# Patient Record
Sex: Female | Born: 2000 | Hispanic: No | Marital: Single | Smoking: Never smoker
Health system: Southern US, Community
[De-identification: ages and names within clinical notes are randomized; demographics above are authoritative.]

## PROBLEM LIST (undated history)

## (undated) DIAGNOSIS — F332 Major depressive disorder, recurrent severe without psychotic features: Secondary | ICD-10-CM

## (undated) DIAGNOSIS — Z789 Other specified health status: Secondary | ICD-10-CM

---
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
## 2022-09-22 IMAGING — CR DG CHEST 2V
2 series · 2 of 2 positions shown · non-contrast
Comparison: None.

CLINICAL DATA: Chest pain

EXAM:
CHEST - 2 VIEW

[chest pa]
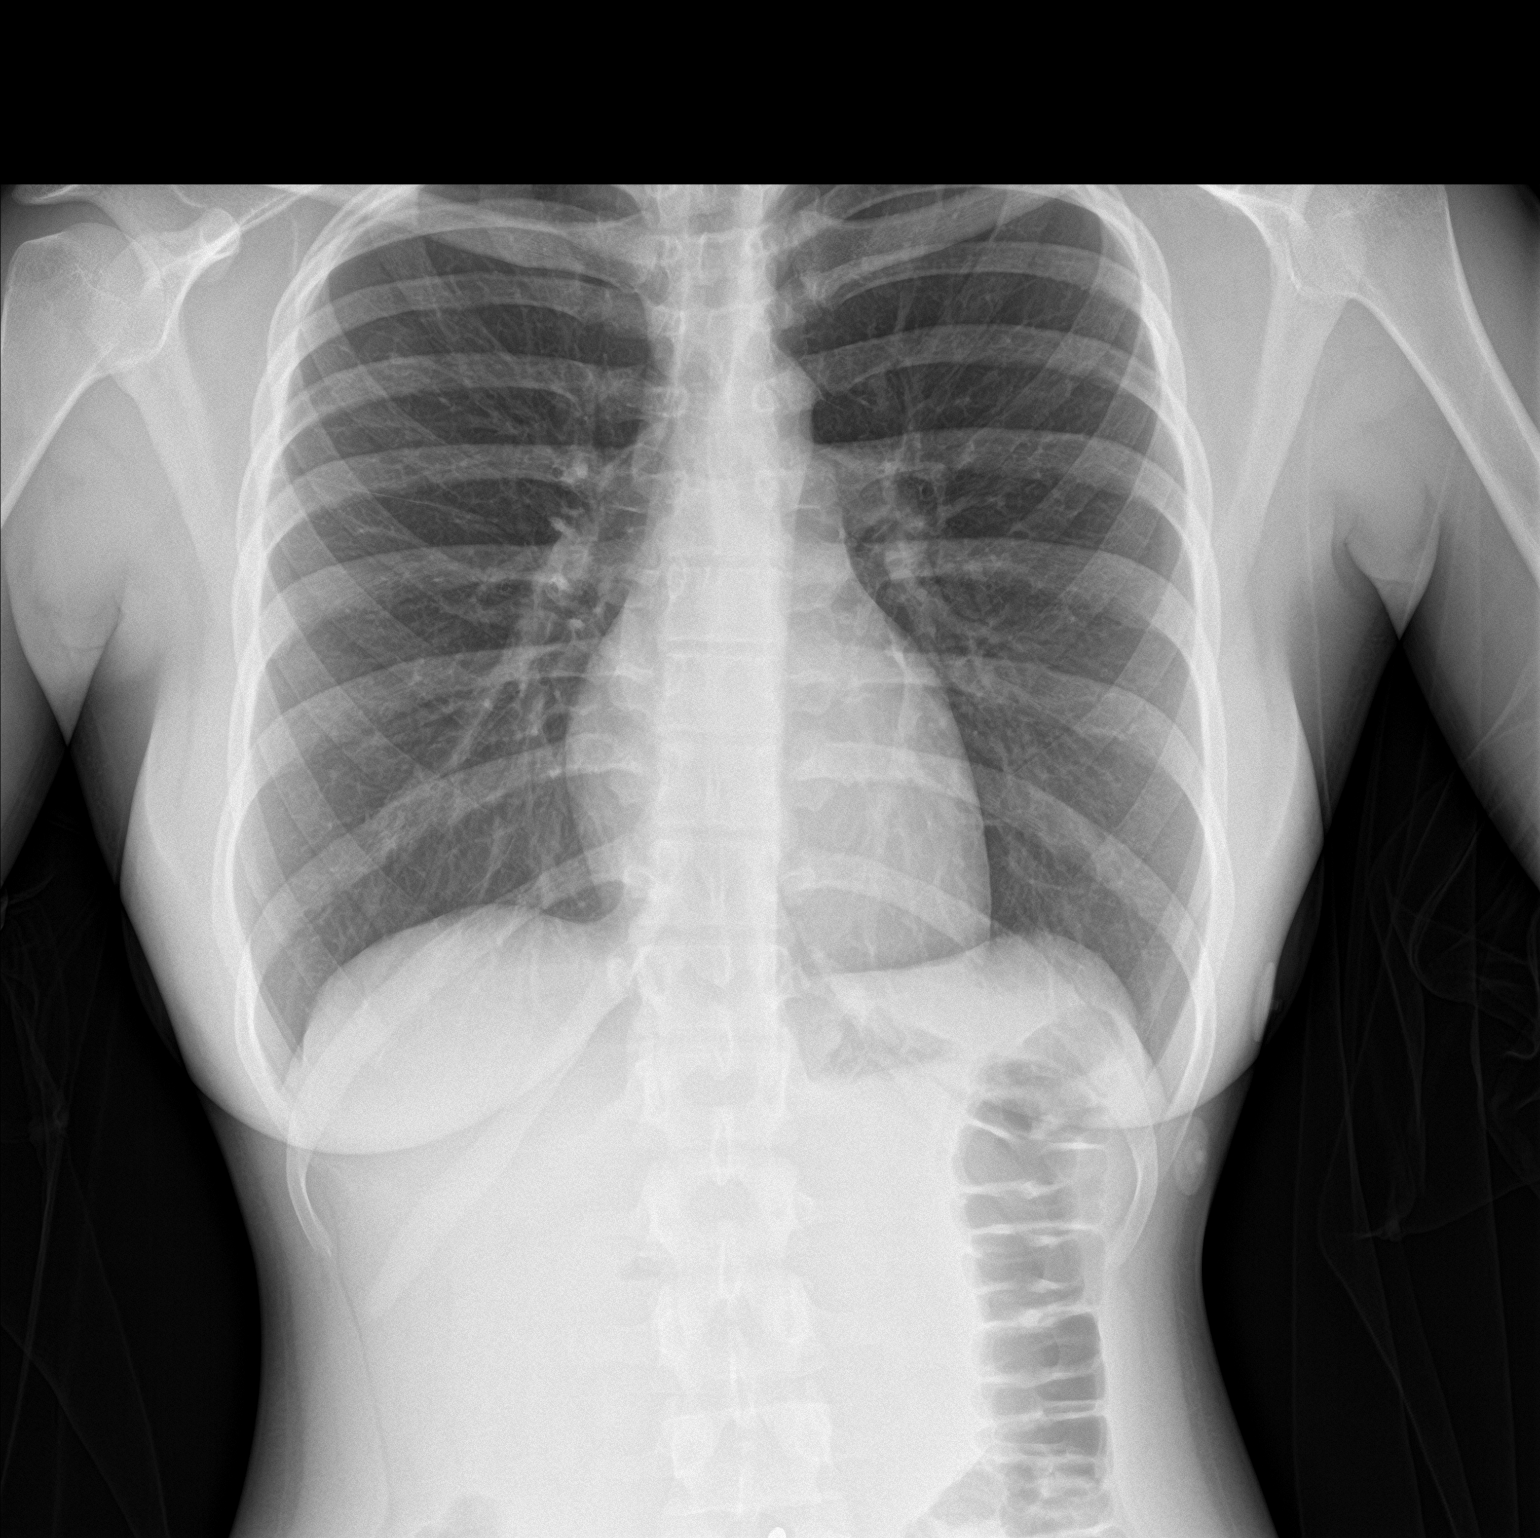

[chest lat]
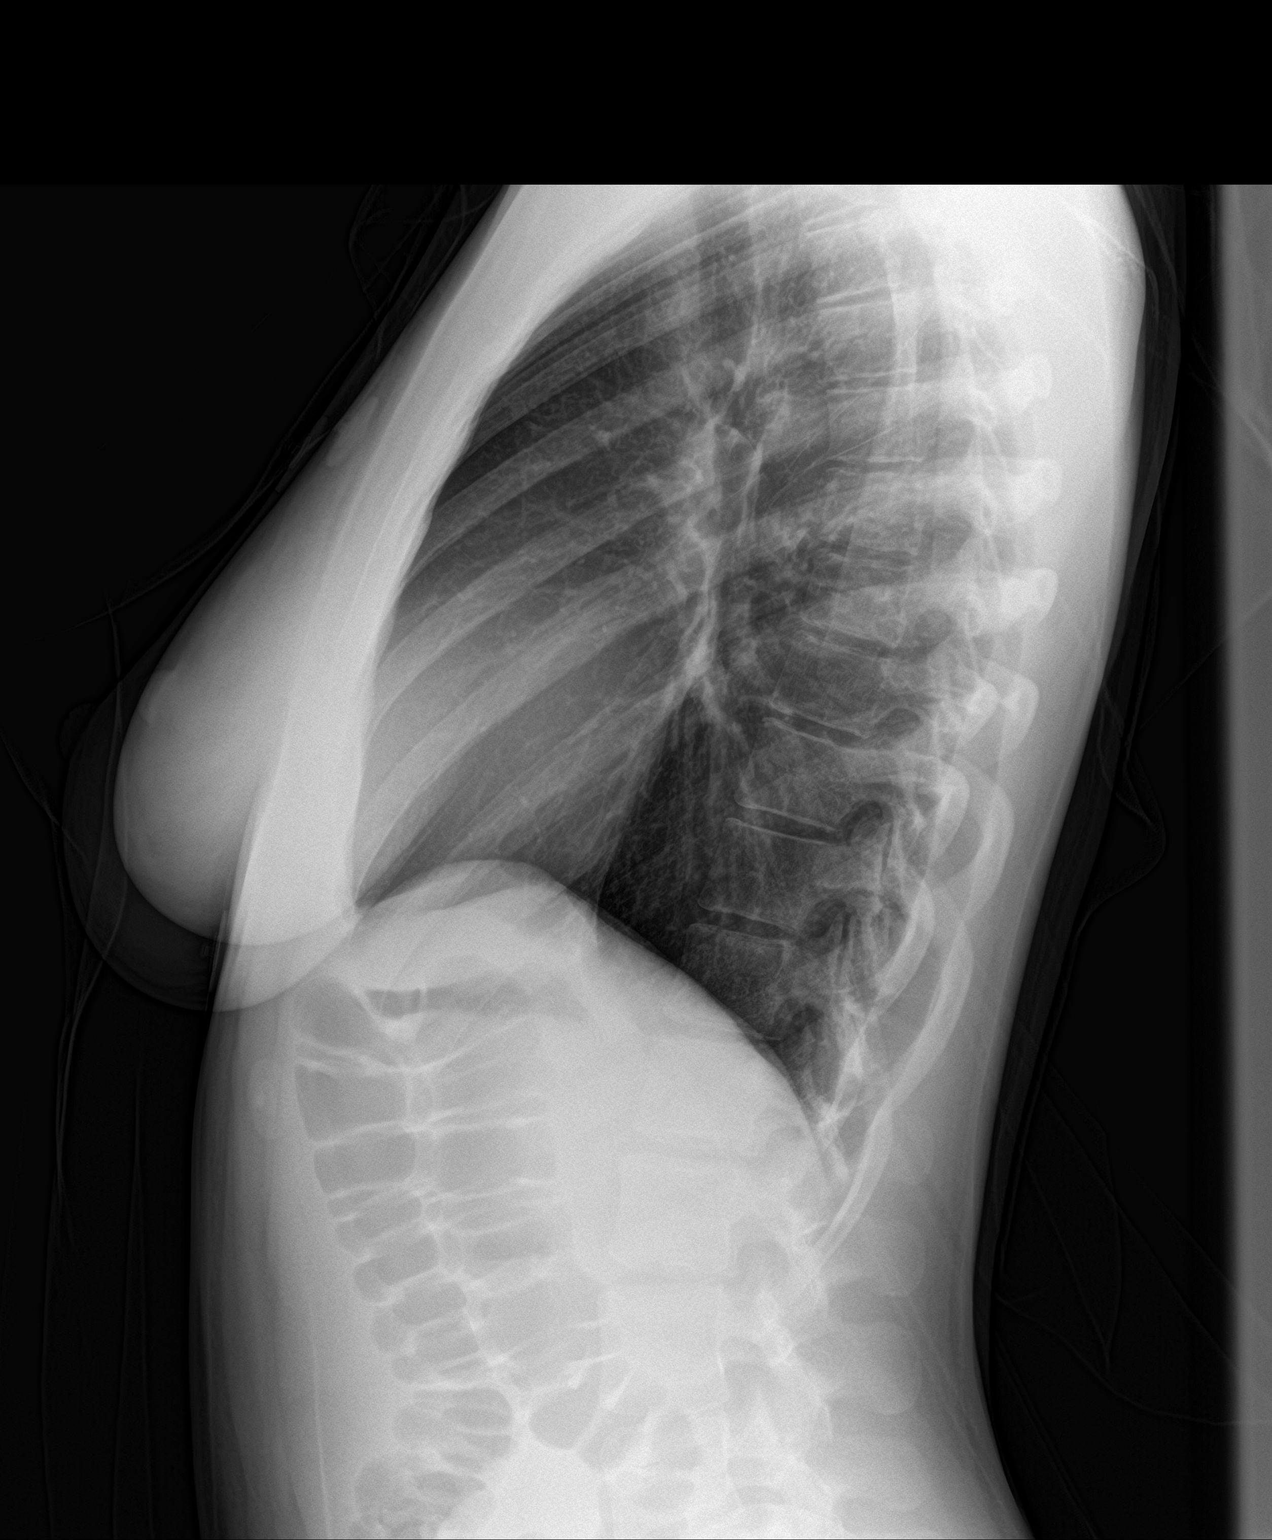

[2 of 2 positions shown; findings below may reference images not displayed]

FINDINGS: The heart size and mediastinal contours are within normal limits.
Both lungs are clear. The visualized skeletal structures are
unremarkable.
IMPRESSION: No active cardiopulmonary disease.

## 2024-02-07 ENCOUNTER — Emergency Department (HOSPITAL_BASED_OUTPATIENT_CLINIC_OR_DEPARTMENT_OTHER)
Admission: EM | Admit: 2024-02-07 | Discharge: 2024-02-07 | Disposition: A | Discharge status: Home / Self Care | Attending: Emergency Medicine | Admitting: Emergency Medicine

## 2024-02-07 ENCOUNTER — Encounter (HOSPITAL_BASED_OUTPATIENT_CLINIC_OR_DEPARTMENT_OTHER): Payer: Self-pay

## 2024-02-07 ENCOUNTER — Inpatient Hospital Stay (HOSPITAL_COMMUNITY)
Admission: AD | Admit: 2024-02-07 | Discharge: 2024-02-11 | DRG: 885 | Disposition: A | Discharge status: Home / Self Care | Source: Intra-hospital | Attending: Psychiatry | Admitting: Psychiatry

## 2024-02-07 ENCOUNTER — Other Ambulatory Visit: Payer: Self-pay

## 2024-02-07 ENCOUNTER — Encounter (HOSPITAL_COMMUNITY): Payer: Self-pay | Admitting: Psychiatry

## 2024-02-07 DIAGNOSIS — E559 Vitamin D deficiency, unspecified: Secondary | ICD-10-CM | POA: Diagnosis present

## 2024-02-07 DIAGNOSIS — E7800 Pure hypercholesterolemia, unspecified: Secondary | ICD-10-CM | POA: Diagnosis present

## 2024-02-07 DIAGNOSIS — Z8249 Family history of ischemic heart disease and other diseases of the circulatory system: Secondary | ICD-10-CM | POA: Diagnosis not present

## 2024-02-07 DIAGNOSIS — F410 Panic disorder [episodic paroxysmal anxiety] without agoraphobia: Secondary | ICD-10-CM | POA: Diagnosis present

## 2024-02-07 DIAGNOSIS — Z5982 Transportation insecurity: Secondary | ICD-10-CM | POA: Diagnosis not present

## 2024-02-07 DIAGNOSIS — F411 Generalized anxiety disorder: Secondary | ICD-10-CM | POA: Diagnosis present

## 2024-02-07 DIAGNOSIS — F332 Major depressive disorder, recurrent severe without psychotic features: Principal | ICD-10-CM | POA: Diagnosis present

## 2024-02-07 DIAGNOSIS — Z818 Family history of other mental and behavioral disorders: Secondary | ICD-10-CM

## 2024-02-07 DIAGNOSIS — F329 Major depressive disorder, single episode, unspecified: Secondary | ICD-10-CM | POA: Diagnosis present

## 2024-02-07 DIAGNOSIS — Z79899 Other long term (current) drug therapy: Secondary | ICD-10-CM | POA: Diagnosis not present

## 2024-02-07 DIAGNOSIS — Z9151 Personal history of suicidal behavior: Secondary | ICD-10-CM | POA: Diagnosis not present

## 2024-02-07 DIAGNOSIS — F449 Dissociative and conversion disorder, unspecified: Secondary | ICD-10-CM | POA: Diagnosis present

## 2024-02-07 DIAGNOSIS — R45851 Suicidal ideations: Secondary | ICD-10-CM | POA: Diagnosis present

## 2024-02-07 DIAGNOSIS — Z5941 Food insecurity: Secondary | ICD-10-CM | POA: Diagnosis not present

## 2024-02-07 DIAGNOSIS — Z5986 Financial insecurity: Secondary | ICD-10-CM | POA: Diagnosis not present

## 2024-02-07 DIAGNOSIS — Z825 Family history of asthma and other chronic lower respiratory diseases: Secondary | ICD-10-CM | POA: Diagnosis not present

## 2024-02-07 HISTORY — DX: Major depressive disorder, recurrent severe without psychotic features: F33.2

## 2024-02-07 LAB — RAPID URINE DRUG SCREEN, HOSP PERFORMED
Amphetamines: NOT DETECTED
Barbiturates: NOT DETECTED
Benzodiazepines: NOT DETECTED
Cocaine: NOT DETECTED
Opiates: NOT DETECTED
Tetrahydrocannabinol: NOT DETECTED

## 2024-02-07 LAB — CBC WITH DIFFERENTIAL/PLATELET
Abs Immature Granulocytes: 0.03 10*3/uL (ref 0.00–0.07)
Basophils Absolute: 0 10*3/uL (ref 0.0–0.1)
Basophils Relative: 1 %
Eosinophils Absolute: 0.1 10*3/uL (ref 0.0–0.5)
Eosinophils Relative: 2 %
HCT: 38.6 % (ref 36.0–46.0)
Hemoglobin: 12.3 g/dL (ref 12.0–15.0)
Immature Granulocytes: 1 %
Lymphocytes Relative: 33 %
Lymphs Abs: 1.7 10*3/uL (ref 0.7–4.0)
MCH: 30.1 pg (ref 26.0–34.0)
MCHC: 31.9 g/dL (ref 30.0–36.0)
MCV: 94.4 fL (ref 80.0–100.0)
Monocytes Absolute: 0.2 10*3/uL (ref 0.1–1.0)
Monocytes Relative: 4 %
Neutro Abs: 3.1 10*3/uL (ref 1.7–7.7)
Neutrophils Relative %: 59 %
Platelets: 231 10*3/uL (ref 150–400)
RBC: 4.09 MIL/uL (ref 3.87–5.11)
RDW: 13.4 % (ref 11.5–15.5)
WBC: 5.2 10*3/uL (ref 4.0–10.5)
nRBC: 0 % (ref 0.0–0.2)

## 2024-02-07 LAB — COMPREHENSIVE METABOLIC PANEL WITH GFR
ALT: 15 U/L (ref 0–44)
AST: 18 U/L (ref 15–41)
Albumin: 4.7 g/dL (ref 3.5–5.0)
Alkaline Phosphatase: 38 U/L (ref 38–126)
Anion gap: 9 (ref 5–15)
BUN: 10 mg/dL (ref 6–20)
CO2: 25 mmol/L (ref 22–32)
Calcium: 9.1 mg/dL (ref 8.9–10.3)
Chloride: 104 mmol/L (ref 98–111)
Creatinine, Ser: 0.79 mg/dL (ref 0.44–1.00)
GFR, Estimated: >60 mL/min (ref >60)
Glucose, Bld: 98 mg/dL (ref 70–99)
Potassium: 3.5 mmol/L (ref 3.5–5.1)
Sodium: 138 mmol/L (ref 135–145)
Total Bilirubin: 0.5 mg/dL (ref 0.0–1.2)
Total Protein: 7.3 g/dL (ref 6.5–8.1)

## 2024-02-07 LAB — ETHANOL: Alcohol, Ethyl (B): <10 mg/dL (ref <10)

## 2024-02-07 LAB — LIPID PANEL
Cholesterol: 220 mg/dL — ABNORMAL HIGH (ref 0–200)
HDL: 80 mg/dL (ref >40)
LDL Cholesterol: 123 mg/dL — ABNORMAL HIGH (ref 0–99)
Total CHOL/HDL Ratio: 2.8 ratio
Triglycerides: 83 mg/dL (ref <150)
VLDL: 17 mg/dL (ref 0–40)

## 2024-02-07 LAB — CBC
HCT: 39.3 % (ref 36.0–46.0)
Hemoglobin: 13 g/dL (ref 12.0–15.0)
MCH: 29.8 pg (ref 26.0–34.0)
MCHC: 33.1 g/dL (ref 30.0–36.0)
MCV: 90.1 fL (ref 80.0–100.0)
Platelets: 275 10*3/uL (ref 150–400)
RBC: 4.36 MIL/uL (ref 3.87–5.11)
RDW: 13.2 % (ref 11.5–15.5)
WBC: 7.9 10*3/uL (ref 4.0–10.5)
nRBC: 0 % (ref 0.0–0.2)

## 2024-02-07 LAB — SALICYLATE LEVEL: Salicylate Lvl: <7 mg/dL — ABNORMAL LOW (ref 7.0–30.0)

## 2024-02-07 LAB — TSH: TSH: 2.022 u[IU]/mL (ref 0.350–4.500)

## 2024-02-07 LAB — PREGNANCY, URINE: Preg Test, Ur: NEGATIVE

## 2024-02-07 LAB — FOLATE: Folate: 12.1 ng/mL (ref >5.9)

## 2024-02-07 LAB — ACETAMINOPHEN LEVEL
Acetaminophen (Tylenol), Serum: <10 ug/mL — ABNORMAL LOW (ref 10–30)
Acetaminophen (Tylenol), Serum: <10 ug/mL — ABNORMAL LOW (ref 10–30)

## 2024-02-07 LAB — VITAMIN B12: Vitamin B-12: 316 pg/mL (ref 180–914)

## 2024-02-07 MED ORDER — MAGNESIUM HYDROXIDE 400 MG/5ML PO SUSP
30.0000 mL | Freq: Every day | ORAL | Status: DC | PRN
  Administered 2024-02-09: 30 mL via ORAL
  Filled 2024-02-07: qty 30

## 2024-02-07 MED ORDER — OLANZAPINE 5 MG PO TBDP: 5.0000 mg | ORAL_TABLET | Freq: Three times a day (TID) | ORAL | Status: DC | PRN

## 2024-02-07 MED ORDER — ACETAMINOPHEN 325 MG PO TABS
650.0000 mg | ORAL_TABLET | Freq: Four times a day (QID) | ORAL | Status: DC | PRN
  Administered 2024-02-07: 650 mg via ORAL
  Filled 2024-02-07: qty 2

## 2024-02-07 MED ORDER — OLANZAPINE 10 MG IM SOLR: 5.0000 mg | Freq: Three times a day (TID) | INTRAMUSCULAR | Status: DC | PRN

## 2024-02-07 MED ORDER — MELATONIN 5 MG PO TABS
5.0000 mg | ORAL_TABLET | Freq: Every day | ORAL | Status: DC
  Administered 2024-02-07 – 2024-02-08 (×2): 5 mg via ORAL
  Filled 2024-02-07 (×4): qty 1

## 2024-02-07 MED ORDER — ESCITALOPRAM OXALATE 10 MG PO TABS
10.0000 mg | ORAL_TABLET | Freq: Every day | ORAL | Status: DC
  Administered 2024-02-07 – 2024-02-10 (×4): 10 mg via ORAL
  Filled 2024-02-07 (×7): qty 1

## 2024-02-07 MED ORDER — OLANZAPINE 10 MG IM SOLR: 10.0000 mg | Freq: Three times a day (TID) | INTRAMUSCULAR | Status: DC | PRN

## 2024-02-07 MED ORDER — ALUM & MAG HYDROXIDE-SIMETH 200-200-20 MG/5ML PO SUSP: 30.0000 mL | ORAL | Status: DC | PRN

## 2024-02-07 MED ORDER — HYDROXYZINE HCL 25 MG PO TABS: 25.0000 mg | ORAL_TABLET | Freq: Three times a day (TID) | ORAL | Status: DC | PRN

## 2024-02-07 NOTE — ED Notes (Signed)
 Tele assessment initiated at this time.

## 2024-02-07 NOTE — ED Notes (Signed)
 Attempted to call report at this time. Spoke with Bennye Alm at [facility] who stated patient to be transported after shift change. # for report [phone]

## 2024-02-07 NOTE — Progress Notes (Signed)
 Patient oriented to unit, reviewed unit rules and schedule. Patient denies any needs, answering questions with yes or no answers, minimal eye contact, patient sitting now quietly in her room reading a bible.   02/07/24 1100  Psych Admission Type (Psych Patients Only)  Admission Status Voluntary  Psychosocial Assessment  Patient Complaints Depression;Hopelessness;Crying spells  Eye Contact Brief  Facial Expression Sad;Flat  Affect Depressed;Sad  Speech Logical/coherent  Interaction Guarded  Motor Activity Other (Comment) (WNL)  Appearance/Hygiene In scrubs  Behavior Characteristics Cooperative;Appropriate to situation  Mood Depressed;Anxious  Thought Process  Coherency WDL  Content WDL  Delusions None reported or observed  Perception WDL  Hallucination None reported or observed  Judgment WDL  Confusion WDL  Danger to Self  Current suicidal ideation? Passive  Description of Suicide Plan no plan  Self-Injurious Behavior No self-injurious ideation or behavior indicators observed or expressed   Agreement Not to Harm Self Yes  Description of Agreement verbal  Danger to Others  Danger to Others None reported or observed

## 2024-02-07 NOTE — Plan of Care (Signed)
   Problem: Education: Goal: Knowledge of [facility] Education information/materials will improve Outcome: Progressing Goal: Emotional status will improve Outcome: Progressing Goal: Mental status will improve Outcome: Progressing Goal: Verbalization of understanding the information provided will improve Outcome: Progressing

## 2024-02-07 NOTE — BH Assessment (Addendum)
 Comprehensive Clinical Assessment (CCA) Note  02/07/2024 Raven Evans [id number] Disposition: Clinician discussed patient care with Raven Guadeloupe, NP.  He recommended inpatient psychiatric care.  Clinician informed Dr. Manus Gunning of disposition recommendation via secure messaging.  Patient has a flat affect throughout assessment.  She is slow to respond at times.  She does not appear to be responding to internal stimuli.  Patient speaks in a slow, soft manner.  She has fair insight.  She has fair appetite and sleep.  Patient has no outpatient care.  She was at [facility] in January '20.     Chief Complaint:  Chief Complaint  Patient presents with   Suicidal   Visit Diagnosis: MDD recurrent, severe    CCA Screening, Triage and Referral (STR)  Patient Reported Information How did you hear about [place]? Self  What Is the Reason for Your Visit/Call Today? A few days ago patient had purchased a large bottle of Tylenol for the purpose of attempting to overdose if she felt like trying to overdose.  Tonight she was having those feelings and tonight she called her mother because she was feeling like she wanted to kill herself.  Pt lives with MGM.  Mother brought her to [facility].  Patient did try to overdose in 2020 and was admitted to [facility].  Patient denies any HI or A/V hallucinations.  Patient denies there being a gun in the home.  Patient denies any use of ETOH, THC or other illicit drugs.  Patient has no current outpatient care.  She is not on any psychiatric meds now.  Pt sleep and appetite are up and down.  Pt just recently started a new job.  How Long Has This Been Causing You Problems? 1 wk - 1 month  What Do You Feel Would Help You the Most Today? Treatment for Depression or other mood problem   Have You Recently Had Any Thoughts About Hurting Yourself? Yes  Are You Planning to Commit Suicide/Harm Yourself At This time? Yes   Flowsheet Row ED from 02/07/2024 in [facility] Department at  [facility] Admission (Discharged) [facility]osp-Admission (Discharged) from 11/18/2018 in [facility] PEDIATRICS  C-SSRS RISK CATEGORY High Risk High Risk High Risk       Have you Recently Had Thoughts About Hurting Someone Raven Evans? No  Are You Planning to Harm Someone at This Time? No  Explanation: Pt had prepared to take her life.   Have You Used Any Alcohol or Drugs in the Past 24 Hours? No  How Long Ago Did You Use Drugs or Alcohol? No data recorded What Did You Use and How Much? No data recorded  Do You Currently Have a Therapist/Psychiatrist? No  Name of Therapist/Psychiatrist:    Have You Been Recently Discharged From Any Office Practice or Programs? No  Explanation of Discharge From Practice/Program: No data recorded    CCA Screening Triage Referral Assessment Type of Contact: Tele-Assessment  Telemedicine Service Delivery:   Is this Initial or Reassessment? Is this Initial or Reassessment?: Initial Assessment  Date Telepsych consult ordered in [facility]:  Date Telepsych consult ordered in [facility]: 02/07/24  Time Telepsych consult ordered in [facility]:  Time Telepsych consult ordered in [facility]: 0103  Location of Assessment: -- ([facility])  Provider Location: [facility] Assessment Services   Collateral Involvement: None   Does Patient Have a Automotive engineer Guardian? No  Legal Guardian Contact Information: Pt has no legal guardian  Copy of Legal Guardianship Form: -- (Pt has no legal guardian)  Legal Guardian Notified of Arrival: -- (Pt has no legal guardian)  Legal Guardian Notified of Pending Discharge: -- (Pt has no legal guardian)  If Minor and Not Living with Parent(s), Who has Custody? Patient is an adult.  Is CPS involved or ever been involved? Never  Is APS involved or ever been involved? Never   Patient Determined To Be At Risk for Harm To Self or Others Based on Review of  Patient Reported Information or Presenting Complaint? Yes, for Self-Harm  Method: Plan with intent and identified person  Availability of Means: Has close by  Intent: -- (Had intention to overdose.)  Notification Required: No need or identified person  Additional Information for Danger to Others Potential: Previous attempts  Additional Comments for Danger to Others Potential: No history of getting into fights.  Are There Guns or Other Weapons in Your Home? No  Types of Guns/Weapons: None reported  Are These Weapons Safely Secured?                            No  Who Could Verify You Are Able To Have These Secured: No one  Do You Have any Outstanding Charges, Pending Court Dates, Parole/Probation? None  Contacted To Inform of Risk of Harm To Self or Others: Other: Comment (N/A)    Does Patient Present under Involuntary Commitment? No    [place] of Residence: [place]   Patient Currently Receiving the Following Services: Not Receiving Services   Determination of Need: Urgent (48 hours)   Options For Referral: Inpatient Hospitalization     CCA Biopsychosocial Patient Reported Schizophrenia/Schizoaffective Diagnosis in Past: No   Strengths: "I like to talk"   Mental Health Symptoms Depression:  Change in energy/activity; Fatigue; Difficulty Concentrating; Increase/decrease in appetite; Hopelessness; Worthlessness; Tearfulness   Duration of Depressive symptoms: Duration of Depressive Symptoms: Greater than two weeks   Mania:  None   Anxiety:   Tension; Worrying; Sleep; Fatigue; Difficulty concentrating   Psychosis:  None   Duration of Psychotic symptoms:    Trauma:  None   Obsessions:  None   Compulsions:  None   Inattention:  None   Hyperactivity/Impulsivity:  None   Oppositional/Defiant Behaviors:  None   Emotional Irregularity:  Chronic feelings of emptiness   Other Mood/Personality Symptoms:  None noted    Mental Status Exam Appearance  and self-care  Stature:  Average   Weight:  Average weight   Clothing:  Casual (In scrubs)   Grooming:  Normal   Cosmetic use:  Age appropriate   Posture/gait:  Normal   Motor activity:  Not Remarkable   Sensorium  Attention:  Normal   Concentration:  Normal   Orientation:  X5   Recall/memory:  Normal   Affect and Mood  Affect:  Blunted; Depressed   Mood:  Depressed; Anxious   Relating  Eye contact:  Normal   Facial expression:  Depressed; Sad   Attitude toward examiner:  Cooperative   Thought and Language  Speech flow: Clear and Coherent   Thought content:  Appropriate to Mood and Circumstances   Preoccupation:  None   Hallucinations:  None   Organization:  Goal-directed; Coherent   [employer] of Knowledge:  Average   Intelligence:  Average   Abstraction:  Normal   Judgement:  Fair   Brewing technologist   Insight:  Fair  Decision Making:  Normal   Social Functioning  Social Maturity:  Isolates   Social Judgement:  Normal   Stress  Stressors:  Other (Comment) (Pt could not identify any.)   Coping Ability:  Exhausted; Overwhelmed   Skill Deficits:  Self-care   Supports:  Family     Religion: Religion/Spirituality Are You A Religious Person?: Yes What is Your Religious Affiliation?: Christian How Might This Affect Treatment?: No affect on treatment.  Leisure/Recreation: Leisure / Recreation Do You Have Hobbies?: No  Exercise/Diet: Exercise/Diet Do You Exercise?: Yes What Type of Exercise Do You Do?: Weight Training How Many Times a Week Do You Exercise?: 1-3 times a week Have You Gained or Lost A Significant Amount of Weight in the Past Six Months?: Yes-Lost Number of Pounds Lost?: 7 Do You Follow a Special Diet?: No Do You Have Any Trouble Sleeping?: Yes Explanation of Sleeping Difficulties: Off and on sleep for the last few days.   CCA Employment/Education Employment/Work  Situation: Employment / Work Situation Employment Situation: Employed Work Stressors: No identified work stressors. Patient's Job has Been Impacted by Current Illness: No Has Patient ever Been in the [employer]?: No  Education: Education Is Patient Currently Attending School?: No Last Grade Completed: 12 Did You Attend College?: No Did You Have An Individualized Education Program (IIEP): No Did You Have Any Difficulty At School?: No Patient's Education Has Been Impacted by Current Illness: No   CCA Family/Childhood History Family and Relationship History: Family history Marital status: Single Does patient have children?: No  Childhood History:  Childhood History By whom was/is the patient raised?: Mother Did patient suffer any verbal/emotional/physical/sexual abuse as a child?: No Did patient suffer from severe childhood neglect?: No Has patient ever been sexually abused/assaulted/raped as an adolescent or adult?: No Was the patient ever a victim of a crime or a disaster?: No Witnessed domestic violence?: No Has patient been affected by domestic violence as an adult?: No       CCA Substance Use Alcohol/Drug Use: Alcohol / Drug Use Pain Medications: None Prescriptions: None Over the Counter: Acetaminophen History of alcohol / drug use?: No history of alcohol / drug abuse Longest period of sobriety (when/how long): NA                         ASAM's:  Six Dimensions of Multidimensional Assessment  Dimension 1:  Acute Intoxication and/or Withdrawal Potential:      Dimension 2:  Biomedical Conditions and Complications:      Dimension 3:  Emotional, Behavioral, or Cognitive Conditions and Complications:     Dimension 4:  Readiness to Change:     Dimension 5:  Relapse, Continued use, or Continued Problem Potential:     Dimension 6:  Recovery/Living Environment:     ASAM Severity Score:    ASAM Recommended Level of Treatment:     Substance use Disorder  (SUD)    Recommendations for Services/Supports/Treatments:    Disposition Recommendation per psychiatric provider: We recommend inpatient psychiatric hospitalization when medically cleared. Patient is under voluntary admission status at this time; please IVC if attempts to leave hospital.   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

## 2024-02-07 NOTE — ED Notes (Signed)
 Discharged to [employer]. Patient awake, alert, and cooperative. Consent and EKG paperwork given to [employer].

## 2024-02-07 NOTE — Progress Notes (Signed)
 Patient accepted to [facility] 307-2. Accepting provider Sindy Guadeloupe. Attending Dr Abbott Pao. Vol treatment for consent needs to be uploaded.

## 2024-02-07 NOTE — ED Notes (Signed)
 [employer] called, demo and waiver faxed to [phone]

## 2024-02-07 NOTE — ED Notes (Signed)
 Patient moved to ED room 10. All potentially harmful objects removed. Mother remains at bedside. Patient in clear view of staff at all times. NAD noted

## 2024-02-07 NOTE — BHH Suicide Risk Assessment (Signed)
 [facility] Admission Suicide Risk Assessment   Nursing information obtained from:    Demographic factors:  Unemployed Current Mental Status:  Suicidal ideation indicated by patient Loss Factors:  Financial problems / change in socioeconomic status Historical Factors:  Prior suicide attempts Risk Reduction Factors:  Living with another person, especially a relative  Total Time spent with patient: 1 hour Principal Problem: MDD (major depressive disorder) Diagnosis:  Principal Problem:   MDD (major depressive disorder)  Subjective Data: Raven Evans is a 23 y.o. female who has a past medical history of History of psychiatric hospitalization (2020), Major depressive disorder, recurrent severe without psychotic features (HCC), and Suicide attempt by acetaminophen overdose (HCC) (2020). She presented from the ED for MDD (major depressive disorder) [F32.9].  She reports worsening depression and suicidal ideation in the context of multiple psychosocial stressors.   Continued Clinical Symptoms:  Alcohol Use Disorder Identification Test Final Score (AUDIT): 0 The "Alcohol Use Disorders Identification Test", Guidelines for Use in Primary Care, Second Edition.  World Science writer [facility]). Score between 0-7:  no or low risk or alcohol related problems. Score between 8-15:  moderate risk of alcohol related problems. Score between 16-19:  high risk of alcohol related problems. Score 20 or above:  warrants further diagnostic evaluation for alcohol dependence and treatment.   CLINICAL FACTORS:   Severe Anxiety and/or Agitation Panic Attacks Depression:   Anhedonia Hopelessness Severe Unstable or Poor Therapeutic Relationship   Musculoskeletal: Strength & Muscle Tone: within normal limits Gait & Station: normal Patient leans: N/A  Psychiatric Specialty Exam:  Presentation  General Appearance:  Fairly Groomed  Eye Contact: Good  Speech: Slow  Speech  Volume: Decreased  Handedness: Right   Mood and Affect  Mood: Depressed; Anxious; Hopeless; Worthless  Affect: Restricted; Congruent; Tearful   Thought Process  Thought Processes: Linear  Descriptions of Associations:Intact  Orientation:Full (Time, Place and Person)  Thought Content:Logical  History of Schizophrenia/Schizoaffective disorder:No  Duration of Psychotic Symptoms:No data recorded Hallucinations:Hallucinations: None  Ideas of Reference:None  Suicidal Thoughts:Suicidal Thoughts: No  Homicidal Thoughts:Homicidal Thoughts: No   Sensorium  Memory: Immediate Good; Recent Good  Judgment: Fair  Insight: Fair   Art therapist  Concentration: Fair  Attention Span: Good  Recall: Good  Fund of Knowledge: Good  Language: Good   Psychomotor Activity  Psychomotor Activity: Psychomotor Activity: Decreased   Assets  Assets: Communication Skills; Desire for Improvement; Housing   Sleep  Sleep: Sleep: Fair    Physical Exam: Physical Exam ROS Blood pressure 108/68, pulse 80, temperature 98 F (36.7 C), temperature source Oral, resp. rate 19, height 5\' 5"  (1.651 m), weight 56.7 kg, SpO2 100%. Body mass index is 20.8 kg/m.   COGNITIVE FEATURES THAT CONTRIBUTE TO RISK:  None    SUICIDE RISK:   Severe:  Frequent, intense, and enduring suicidal ideation, specific plan, no subjective intent, but some objective markers of intent (i.e., choice of lethal method), the method is accessible, some limited preparatory behavior, evidence of impaired self-control, severe dysphoria/symptomatology, multiple risk factors present, and few if any protective factors, particularly a lack of social support.  PLAN OF CARE: SEE HPI  I certify that inpatient services furnished can reasonably be expected to improve the patient's condition.   Golda Acre, MD 02/07/2024, 12:04 PM

## 2024-02-07 NOTE — ED Notes (Signed)
 Instructed by our night shift nurse, Victorino Dike to call report after shift change. [facility]. Nurses are still in their shift change and instructed by clerical to call back in 30 minutes.

## 2024-02-07 NOTE — Progress Notes (Signed)
 Per chart patient was accepted over night at [facility].

## 2024-02-07 NOTE — BHH Group Notes (Signed)
 BHH Group Notes:  (Nursing/MHT/Case Management/Adjunct)  Date:  02/07/2024  Time:  8:29 PM  Type of Therapy:   AA group  Participation Level:  Active  Participation Quality:  Appropriate  Affect:  Appropriate  Cognitive:  Appropriate  Insight:  Appropriate  Engagement in Group:  Engaged  Modes of Intervention:  Education  Summary of Progress/Problems: attended [employer].  Noah Delaine 02/07/2024, 8:29 PM

## 2024-02-07 NOTE — Progress Notes (Signed)
   02/07/24 2200  Psych Admission Type (Psych Patients Only)  Admission Status Voluntary  Psychosocial Assessment  Patient Complaints Anxiety;Decreased concentration;Depression;Hopelessness;Sadness;Worrying  Eye Contact Brief  Facial Expression Flat  Affect Depressed;Sad;Apprehensive  Speech Logical/coherent  Interaction Guarded  Motor Activity  (WNL)  Appearance/Hygiene Unremarkable  Behavior Characteristics Anxious;Guarded  Mood Anxious;Apprehensive  Thought Process  Coherency WDL  Content WDL  Delusions None reported or observed  Perception WDL  Hallucination None reported or observed  Judgment WDL  Confusion WDL  Danger to Self  Current suicidal ideation? Passive  Description of Suicide Plan None  Self-Injurious Behavior No self-injurious ideation or behavior indicators observed or expressed   Agreement Not to Harm Self Yes  Description of Agreement Verbal contract for safety  Danger to Others  Danger to Others None reported or observed

## 2024-02-07 NOTE — ED Provider Notes (Signed)
 [place] EMERGENCY DEPARTMENT AT [facility] Provider Note   CSN: [id number] Arrival date & time: 02/07/24  0022     History  Chief Complaint  Patient presents with   Suicidal    Raven Evans is a 23 y.o. female.  Patient brought in with mother.  States she is feeling suicidal like "I do not want to be here anymore".  Thoughts have been persistent for several months but getting worse.  Does not take any medication for anxiety or depression.  Has a plan to overdose on pills but has not acted on this plan.  History of Tylenol ingestion in the past.  Did take melatonin tonight to attempt to sleep.  Denies any other alcohol or drug use.  No prescription medications.  Denies any homicidal thoughts or hallucinations.  No physical complaints.  No head, neck, back, chest or abdominal pain.  No chest pain or shortness of breath.  No nausea, vomiting or diarrhea.  No abdominal pain.  The history is provided by the patient and a parent.       Home Medications Prior to Admission medications   Not on File      Allergies    Patient has no known allergies.    Review of Systems   Review of Systems  Constitutional:  Negative for activity change, appetite change and fever.  HENT:  Negative for congestion.   Respiratory:  Negative for cough, chest tightness and shortness of breath.   Cardiovascular:  Negative for chest pain.  Gastrointestinal:  Negative for abdominal pain, nausea and vomiting.  Genitourinary:  Negative for dysuria and hematuria.  Musculoskeletal:  Negative for arthralgias and myalgias.  Skin:  Negative for rash.  Neurological:  Negative for dizziness, weakness and headaches.  Psychiatric/Behavioral:  Positive for dysphoric mood, self-injury and suicidal ideas.    all other systems are negative except as noted in the HPI and PMH.    Physical Exam Updated Vital Signs BP 131/82 (BP Location: Right Arm)   Pulse (!) 102   Temp 98.1 F (36.7 C)   Resp 18   Ht  5\' 5"  (1.651 m)   Wt 56.7 kg   SpO2 100%   BMI 20.80 kg/m  Physical Exam Vitals and nursing note reviewed.  Constitutional:      General: She is not in acute distress.    Appearance: She is well-developed.     Comments: Flat affect  HENT:     Head: Normocephalic and atraumatic.     Mouth/Throat:     Pharynx: No oropharyngeal exudate.  Eyes:     Conjunctiva/sclera: Conjunctivae normal.     Pupils: Pupils are equal, round, and reactive to light.  Neck:     Comments: No meningismus. Cardiovascular:     Rate and Rhythm: Normal rate and regular rhythm.     Heart sounds: Normal heart sounds. No murmur heard. Pulmonary:     Effort: Pulmonary effort is normal. No respiratory distress.     Breath sounds: Normal breath sounds.  Abdominal:     Palpations: Abdomen is soft.     Tenderness: There is no abdominal tenderness. There is no guarding or rebound.  Musculoskeletal:        General: No tenderness. Normal range of motion.     Cervical back: Normal range of motion and neck supple.  Skin:    General: Skin is warm.  Neurological:     Mental Status: She is alert and oriented to person, place, and time.  Cranial Nerves: No cranial nerve deficit.     Motor: No abnormal muscle tone.     Coordination: Coordination normal.     Comments:  5/5 strength throughout. CN 2-12 intact.Equal grip strength.   Psychiatric:        Behavior: Behavior normal.     ED Results / Procedures / Treatments   Labs (all labs ordered are listed, but only abnormal results are displayed) Labs Reviewed  SALICYLATE LEVEL - Abnormal; Notable for the following components:      Result Value   Salicylate Lvl <7.0 (*)    All other components within normal limits  ACETAMINOPHEN LEVEL - Abnormal; Notable for the following components:   Acetaminophen (Tylenol), Serum <10 (*)    All other components within normal limits  COMPREHENSIVE METABOLIC PANEL WITH GFR  ETHANOL  CBC  RAPID URINE DRUG SCREEN, HOSP  PERFORMED  PREGNANCY, URINE    EKG None  Radiology No results found.  Procedures Procedures    Medications Ordered in ED Medications - No data to display  ED Course/ Medical Decision Making/ A&P                                 Medical Decision Making Amount and/or Complexity of Data Reviewed Labs: ordered. Decision-making details documented in ED Course. Radiology: ordered and independent interpretation performed. Decision-making details documented in ED Course. ECG/medicine tests: ordered and independent interpretation performed. Decision-making details documented in ED Course.   Suicidal thoughts with plan to overdose.  Calm and cooperative at this time.  Stable vital signs.  No medical complaints.  Will obtain screening labs.  Screening labs and toxicology labs are unremarkable.  Patient remained stable.  She is medically clear for psychiatric evaluation.  Holding orders placed.        Final Clinical Impression(s) / ED Diagnoses Final diagnoses:  None    Rx / DC Orders ED Discharge Orders     None         Holten Spano, Jeannett Senior, MD 02/07/24 [phone]

## 2024-02-07 NOTE — H&P (Signed)
 Psychiatric Admission Assessment Adult  Patient Identification: Raven Evans  MRN:  [id number]  Date of Evaluation:  02/07/24  Chief Complaint:  MDD (major depressive disorder) [F32.9]   Principal Diagnosis: MDD (major depressive disorder), recurrent severe, without psychosis (HCC)  Diagnosis:  Principal Problem:   MDD (major depressive disorder), recurrent severe, without psychosis (HCC) Active Problems:   Dissociation   Generalized anxiety disorder with panic attacks    Chief Complaint: "I plan to kill myself but I called my mom instead."   History of Present Illness: Raven Evans is a 23 y.o. who  has a past medical history of History of psychiatric hospitalization (2020), Major depressive disorder, recurrent severe without psychotic features (HCC), and Suicide attempt by acetaminophen overdose (HCC) (2020).  She presented to [facility] for MDD (major depressive disorder), recurrent severe, without psychosis (HCC).  She presents for suicidal ideation and aborted suicide attempt in the context of worsening depression and increased psychosocial stressors.  She appears to be a fair historian.  The patient reports that she has been suffering from depression on and off for years.  She reports that she has also had suicidal ideation on and off for years, but has not attempted suicide since she was 23 years old.  She reports that her family and religion are strong protective factors; however, in light of her recent psychosocial stressors, she has felt overwhelmed, hopeless, and helpless.  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  She moved in with her grandmother and reports that the living arrangement is less than ideal.  The patient reports symptoms of major depressive disorder including depressed mood, anhedonia, decreased appetite, increased fatigue, feelings of hopelessness,  helplessness, and worthlessness, feelings of guilt, decreased concentration, recurrent thoughts of death, and suicidal ideation.   The patient is noted to dissociate on exam, which can be associated with depression, but also PTSD and borderline personality disorder among other conditions.  She denies a history of trauma, but may feel uncomfortable reporting at this time.  These conditions cannot be excluded from the differential diagnosis.  The patient reports some symptoms of borderline personality disorder including unstable relationships, identity disturbance, impulsivity, and suicidal behaviors, emotional instability, persistent feelings of emptiness, inappropriate anger, and transient stress-related dissociation.   She reports that she has been suffering from increased generalized anxiety with panic attacks.  He states that she has had 2 significant panic attacks recently.  We discussed starting Lexapro for depression and anxiety, and continuing home melatonin for insomnia.   Past Psychiatric History: She  has a past medical history of History of psychiatric hospitalization (2020), Major depressive disorder, recurrent severe without psychotic features (HCC), and Suicide attempt by acetaminophen overdose (HCC) (2020).   Is the patient at risk to self? yes Has the patient been a risk to self in the past 6 months? No Has the patient been a risk to self within the distant past? No Is the patient a risk to others? No Has the patient been a risk to others in the past 6 months? No Has the patient been a risk to others within the distant past? No  [place] Scale:  Flowsheet Row [facility] Most recent reading at 02/07/2024 11:00 AM ED from 02/07/2024 in [facility] Emergency Department at [facility] Most recent reading at 02/07/2024 12:38 AM Admission (  Discharged) [facility]  [facility]B Most recent reading at 11/20/2018 12:39 PM  C-SSRS RISK CATEGORY High Risk High Risk High Risk          Prior Inpatient Therapy: Yes Prior Outpatient Therapy: No  Alcohol Screening:  1. How often do you have a drink containing alcohol?: Never 2. How many drinks containing alcohol do you have on a typical day when you are drinking?: 1 or 2 3. How often do you have six or more drinks on one occasion?: Never AUDIT-C Score: 0 4. How often during the last year have you found that you were not able to stop drinking once you had started?: Never 5. How often during the last year have you failed to do what was normally expected from you because of drinking?: Never 6. How often during the last year have you needed a first drink in the morning to get yourself going after a heavy drinking session?: Never 7. How often during the last year have you had a feeling of guilt of remorse after drinking?: Never 8. How often during the last year have you been unable to remember what happened the night before because you had been drinking?: Never 9. Have you or someone else been injured as a result of your drinking?: No 10. Has a relative or friend or a doctor or another health worker been concerned about your drinking or suggested you cut down?: No Alcohol Use Disorder Identification Test Final Score (AUDIT): 0 Alcohol Brief Interventions/Follow-up: Alcohol education/Brief advice  Substance Abuse History in the last 12 months: Denies Consequences of Substance Abuse: NA  Previous Psychotropic Medications: Yes Psychological Evaluations: No  Past Medical History:  Past Medical History:  Diagnosis Date   History of psychiatric hospitalization 2020   Major depressive disorder, recurrent severe without psychotic features (HCC)    Suicide attempt by acetaminophen overdose (HCC) 2020     Family Psychiatric & Medical History: She reports that she has a grandmother with bipolar disorder Family History   Problem Relation Age of Onset   Depression Maternal Grandmother    COPD Maternal Grandfather    Hypertension Paternal Grandmother      Tobacco Screening:  Social History   Tobacco Use  Smoking Status Never  Smokeless Tobacco Never      Social History:  Social History   Substance and Sexual Activity  Alcohol Use No      Additional Social History:       Allergies:  No Known Allergies   Lab Results:  Results for orders placed or performed during the hospital encounter of 02/07/24 (from the past 48 hours)  Comprehensive metabolic panel     Status: None   Collection Time: 02/07/24 12:42 AM  Result Value Ref Range   Sodium 138 135 - 145 mmol/L   Potassium 3.5 3.5 - 5.1 mmol/L   Chloride 104 98 - 111 mmol/L   CO2 25 22 - 32 mmol/L   Glucose, Bld 98 70 - 99 mg/dL    Comment: Glucose reference range applies only to samples taken after fasting for at least 8 hours.   BUN 10 6 - 20 mg/dL   Creatinine, Ser 7.82 0.44 - 1.00 mg/dL   Calcium 9.1 8.9 - 95.6 mg/dL   Total Protein 7.3 6.5 - 8.1 g/dL   Albumin 4.7 3.5 - 5.0 g/dL   AST 18 15 - 41 U/L   ALT 15 0 - 44 U/L   Alkaline Phosphatase 38  38 - 126 U/L   Total Bilirubin 0.5 0.0 - 1.2 mg/dL   GFR, Estimated >82 >95 mL/min    Comment: (NOTE) Calculated using the CKD-EPI Creatinine Equation (2021)    Anion gap 9 5 - 15    Comment: Performed at [facility], [address], [place], [place] [address]  Ethanol     Status: None   Collection Time: 02/07/24 12:42 AM  Result Value Ref Range   Alcohol, Ethyl (B) <10 <10 mg/dL    Comment: (NOTE) Lowest detectable limit for serum alcohol is 10 mg/dL.  For medical purposes only. Performed at [facility], [address] [place], [place] [address]   Salicylate level     Status: Abnormal   Collection Time: 02/07/24 12:42 AM  Result Value Ref Range   Salicylate Lvl <7.0 (L) 7.0 - 30.0 mg/dL    Comment: Performed at [facility], [address], [place], [place] [address]  Acetaminophen level     Status: Abnormal   Collection Time: 02/07/24 12:42 AM  Result Value Ref Range   Acetaminophen (Tylenol), Serum <10 (L) 10 - 30 ug/mL    Comment: (NOTE) Therapeutic concentrations vary significantly. A range of 10-30 ug/mL  may be an effective concentration for many patients. However, some  are best treated at concentrations outside of this range. Acetaminophen concentrations >150 ug/mL at 4 hours after ingestion  and >50 ug/mL at 12 hours after ingestion are often associated with  toxic reactions.  Performed at [facility], [address], [place], [place] [address]   cbc     Status: None   Collection Time: 02/07/24 12:42 AM  Result Value Ref Range   WBC 7.9 4.0 - 10.5 K/uL   RBC 4.36 3.87 - 5.11 MIL/uL   Hemoglobin 13.0 12.0 - 15.0 g/dL   HCT 13.2 44.0 - 10.2 %   MCV 90.1 80.0 - 100.0 fL   MCH 29.8 26.0 - 34.0 pg   MCHC 33.1 30.0 - 36.0 g/dL   RDW 72.5 36.6 - 44.0 %   Platelets 275 150 - 400 K/uL   nRBC 0.0 0.0 - 0.2 %    Comment: Performed at [facility], [address], [place], [place] [address]  Rapid urine drug screen (hospital performed)     Status: None   Collection Time: 02/07/24 12:47 AM  Result Value Ref Range   Opiates NONE DETECTED NONE DETECTED   Cocaine NONE DETECTED NONE DETECTED   Benzodiazepines NONE DETECTED NONE DETECTED   Amphetamines NONE DETECTED NONE DETECTED   Tetrahydrocannabinol NONE DETECTED NONE DETECTED   Barbiturates NONE DETECTED NONE DETECTED    Comment: (NOTE) DRUG SCREEN FOR MEDICAL PURPOSES ONLY.  IF CONFIRMATION IS NEEDED FOR ANY PURPOSE, NOTIFY LAB WITHIN 5 DAYS.  LOWEST DETECTABLE LIMITS FOR URINE DRUG SCREEN Drug Class                     Cutoff (ng/mL) Amphetamine and metabolites    1000 Barbiturate and metabolites    200 Benzodiazepine                 200 Opiates and metabolites        300 Cocaine and  metabolites        300 THC                            50 Performed at [facility],  [address] [place], [place] [address]   Pregnancy, urine     Status: None   Collection Time: 02/07/24 12:47 AM  Result Value Ref Range   Preg Test, Ur NEGATIVE NEGATIVE    Comment:        THE SENSITIVITY OF THIS METHODOLOGY IS >25 mIU/mL. Performed at [facility], [address] [place], [place] [address]   Acetaminophen level     Status: Abnormal   Collection Time: 02/07/24  4:07 AM  Result Value Ref Range   Acetaminophen (Tylenol), Serum <10 (L) 10 - 30 ug/mL    Comment: Performed at [facility], [address], [place], [place] [address]     Blood Alcohol level:  Lab Results  Component Value Date   [facility] <10 02/07/2024   ETH <10 11/18/2018    Metabolic Disorder Labs:  Lab Results  Component Value Date   HGBA1C 5.7 (H) 11/25/2018   MPG 116.89 11/25/2018   No results found for: "PROLACTIN"  Lab Results  Component Value Date   CHOL 163 11/25/2018   TRIG 50 11/25/2018   HDL 54 11/25/2018   VLDL 10 11/25/2018   LDLCALC 99 11/25/2018      Current Medications: Current Facility-Administered Medications  Medication Dose Route Frequency Provider Last Rate Last Admin   acetaminophen (TYLENOL) tablet 650 mg  650 mg Oral Q6H PRN Sindy Guadeloupe, NP       alum & mag hydroxide-simeth (MAALOX/MYLANTA) 200-200-20 MG/5ML suspension 30 mL  30 mL Oral Q4H PRN Sindy Guadeloupe, NP       escitalopram (LEXAPRO) tablet 10 mg  10 mg Oral QHS Golda Acre, MD       hydrOXYzine (ATARAX) tablet 25 mg  25 mg Oral TID PRN Golda Acre, MD       magnesium hydroxide (MILK OF MAGNESIA) suspension 30 mL  30 mL Oral Daily PRN Sindy Guadeloupe, NP       melatonin tablet 5 mg  5 mg Oral QHS Golda Acre, MD       OLANZapine [facility]) injection 10 mg  10 mg Intramuscular TID PRN Sindy Guadeloupe, NP       OLANZapine (ZYPREXA) injection 5 mg  5 mg  Intramuscular TID PRN Sindy Guadeloupe, NP       OLANZapine zydis (ZYPREXA) disintegrating tablet 5 mg  5 mg Oral TID PRN Sindy Guadeloupe, NP        PTA Medications: No medications prior to admission.     Musculoskeletal: Strength & Muscle Tone: within normal limits Gait & Station: normal Patient leans: N/A    Psychiatric Specialty Exam:  Presentation  General Appearance: Fairly Groomed  Eye Contact: Good  Speech: Slow  Speech Volume: Decreased  Handedness: Right   Mood and Affect  Mood: Depressed; Anxious; Hopeless; Worthless  Affect: Restricted; Congruent; Tearful   Thought Process  Thought Processes: Linear  Descriptions of Associations: Intact  Orientation: Full (Time, Place and Person)  Thought Content: Logical  History of Schizophrenia/Schizoaffective disorder: No  Duration of Psychotic Symptoms: NA Hallucinations: Hallucinations: None  Ideas of Reference: None  Suicidal Thoughts: Suicidal Thoughts: No  Homicidal Thoughts: Homicidal Thoughts: No   Sensorium  Memory: Immediate Good; Recent Good  Judgment: Fair  Insight: Fair   Art therapist  Concentration: Fair  Attention Span: Good  Recall: Good  Fund of Knowledge: Good  Language: Good   Psychomotor Activity  Psychomotor Activity: Psychomotor Activity: Decreased   Assets  Assets: Communication Skills; Desire for Improvement; Housing  Sleep  Sleep: Sleep: Fair    Physical Exam: General: Sitting comfortably. NAD. HEENT: Normocephalic, atraumatic, MMM, EMOI Lungs: no increased work of breathing noted Heart: no cyanosis Abdomen: Non distended Musculoskeletal: FROM. No obvious deformities Skin: Warm, dry, intact. No rashes noted Neuro: No obvious focal deficits.  Gait and station are normal  Review of Systems  Constitutional: Negative.   HENT: Negative.    Eyes: Negative.   Respiratory: Negative.    Cardiovascular: Negative.   Gastrointestinal: Negative.    Genitourinary: Negative.   Skin: Negative.   Neurological: Negative.   Psychiatric/Behavioral:  Positive for depression, anxiety, dissociation.     Blood pressure 108/68, pulse 80, temperature 98 F (36.7 C), temperature source Oral, resp. rate 19, height 5\' 5"  (1.651 m), weight 56.7 kg, SpO2 100%. Body mass index is 20.8 kg/m.   Treatment Plan Summary: ASSESSMENT: Raven Evans is an 23 y.o. female who  has a past medical history of History of psychiatric hospitalization (2020), Major depressive disorder, recurrent severe without psychotic features (HCC), and Suicide attempt by acetaminophen overdose (HCC) (2020).  She presented on 02/07/2024 10:53 AM for MDD (major depressive disorder), recurrent severe, without psychosis (HCC).    Diagnoses / Active Problems: Patient Active Problem List   Diagnosis Date Noted   Dissociation 02/07/2024   Generalized anxiety disorder with panic attacks 02/07/2024   MDD (major depressive disorder), recurrent severe, without psychosis (HCC) 11/21/2018     PLAN: Safety and Monitoring:  -- Voluntary admission to inpatient psychiatric unit for safety, stabilization and treatment  -- Daily contact with patient to assess and evaluate symptoms and progress in treatment  -- Patient's case to be discussed in multi-disciplinary team meeting  -- Observation Level : q15 minute checks  -- Vital signs:  q12 hours  -- Precautions: suicide, elopement, and assault  2. Psychiatric Diagnoses and Treatment:  Patient Active Problem List   Diagnosis Date Noted   Dissociation 02/07/2024   Generalized anxiety disorder with panic attacks 02/07/2024   MDD (major depressive disorder), recurrent severe, without psychosis (HCC) 11/21/2018     Scheduled Medications:  escitalopram  10 mg Oral QHS   melatonin  5 mg Oral QHS     As Needed Medications: acetaminophen, alum & mag hydroxide-simeth, hydrOXYzine, magnesium hydroxide, OLANZapine, OLANZapine, OLANZapine zydis     3. Medical Issues Being Addressed:    Labs reviewed, unremarkable; check standard intake labs    4. Discharge Planning:   -- Social work and case management to assist with discharge planning and identification of hospital follow-up needs prior to discharge  -- Estimated LOS: 5-7 days  -- Discharge Concerns: Need to establish a safety plan; Medication compliance and effectiveness  -- Discharge Goals: Return home with outpatient referrals for mental health follow-up including medication management/psychotherapy  5. Short Term Goals:  Improve ability to identify changes in lifestyle to reduce recurrence of condition, verbalize feelings, disclose and discuss suicidal ideas, demonstrate self-control, identify and develop effective coping behaviors, compliance with prescribed medications, identify triggers associated with substance abuse/mental health issues, participate in unit milieu and in scheduled group therapies   6. Long Term Goals: Improvement in symptoms so the patient is ready for discharge   --The risks/benefits/side-effects/alternatives to the medications above were discussed in detail with the patient and time was given for questions. The patient provided informed consent.   -- Metabolic profile and EKG monitoring obtained while on an atypical antipsychotic and listed in the EHR    Total Time Spent in Direct  Patient Care:  I personally spent 60 minutes on the unit in direct patient care. The direct patient care time included face-to-face time with the patient, reviewing the patient's chart, communicating with other professionals, and coordinating care. Greater than 50% of this time was spent in counseling or coordinating care with the patient regarding goals of hospitalization, psycho-education, and discharge planning needs.   I certify that inpatient services furnished can reasonably be expected to improve the patient's condition.    Criss Alvine, MD 02/07/2024, 12:42  PM      Portions of this note were created using voice recognition software. Minor syntax errors, grammatical content, spelling, or punctuation errors may have occurred unintentionally. Please notify the There[facility] Parkin if the meaning of any statement is unclear.

## 2024-02-07 NOTE — ED Notes (Signed)
 Patient sleeping. Equal chest rise and fall noted. Remains calm and cooperative with staff while awake. Patient remains in clear view of staff at all times. NAD noted

## 2024-02-07 NOTE — Group Note (Signed)
 Date:  02/07/2024 Time:  12:41 PM  Group Topic/Focus:  Goals Group:   The focus of this group is to help patients establish daily goals to achieve during treatment and discuss how the patient can incorporate goal setting into their daily lives to aide in recovery.    Participation Level:  Did Not Attend  Erasmo Score 02/07/2024, 12:41 PM

## 2024-02-07 NOTE — ED Notes (Signed)
 Report given to [facility] at [facility] for room 307-2.

## 2024-02-07 NOTE — ED Triage Notes (Signed)
 Arrives with mother.   Suicidal ideations she says have been intermittent for a long time. Says he had bought a large amount of tylenol to prepare for self harm.   Says she has been praying more and placing faith in God and did not want to harm self.   Previous tylenol ingestion in the past 2020.   No medication consumed tonight except for a melatonin to help sleep.

## 2024-02-08 DIAGNOSIS — F332 Major depressive disorder, recurrent severe without psychotic features: Secondary | ICD-10-CM

## 2024-02-08 LAB — VITAMIN D 25 HYDROXY (VIT D DEFICIENCY, FRACTURES): Vit D, 25-Hydroxy: 22.44 ng/mL — ABNORMAL LOW (ref 30–100)

## 2024-02-08 LAB — RPR: RPR Ser Ql: NONREACTIVE

## 2024-02-08 LAB — HIV ANTIBODY (ROUTINE TESTING W REFLEX): HIV Screen 4th Generation wRfx: NONREACTIVE

## 2024-02-08 MED ORDER — VITAMIN D3 25 MCG PO TABS
2000.0000 [IU] | ORAL_TABLET | Freq: Every day | ORAL | Status: DC
  Administered 2024-02-08 – 2024-02-11 (×4): 2000 [IU] via ORAL
  Filled 2024-02-08 (×7): qty 2

## 2024-02-08 NOTE — Progress Notes (Signed)
   02/08/24 0900  Psych Admission Type (Psych Patients Only)  Admission Status Voluntary  Psychosocial Assessment  Patient Complaints Anxiety;Depression  Eye Contact Brief  Facial Expression Flat  Affect Depressed  Speech Logical/coherent  Interaction Guarded  Motor Activity Other (Comment) (WNL)  Appearance/Hygiene Unremarkable  Behavior Characteristics Anxious;Guarded  Mood Anxious;Apprehensive  Thought Process  Coherency WDL  Content WDL  Delusions None reported or observed  Perception WDL  Hallucination None reported or observed  Judgment WDL  Confusion None  Danger to Self  Current suicidal ideation? Passive  Agreement Not to Harm Self Yes  Description of Agreement verbal  Danger to Others  Danger to Others None reported or observed

## 2024-02-08 NOTE — [facility] Group Notes (Signed)
 Adult Psychoeducational Group Note  Date:  02/08/2024 Time:  9:24 PM  Group Topic/Focus:  Wrap-Up Group:   The focus of this group is to help patients review their daily goal of treatment and discuss progress on daily workbooks.  Participation Level:  Active  Participation Quality:  Appropriate  Affect:  Appropriate  Cognitive:  Appropriate  Insight: Appropriate  Engagement in Group:  Engaged  Modes of Intervention:  Discussion  Additional Comments:  Pt attended group.  Joselyn Arrow 02/08/2024, 9:24 PM

## 2024-02-08 NOTE — Plan of Care (Signed)
  Problem: Education: Goal: Knowledge of [place] [facility] Education information/materials will improve Outcome: Completed/Met Goal: Emotional status will improve Outcome: Progressing Goal: Mental status will improve Outcome: Progressing Goal: Verbalization of understanding the information provided will improve Outcome: Progressing

## 2024-02-08 NOTE — Group Note (Signed)
 LCSW Group Therapy Note  Group Date: 02/08/2024 Start Time: 1000 End Time: 1100   Type of Therapy and Topic:  Group Therapy: Positive Affirmations  Participation Level:  Active   Description of Group:   This group addressed positive affirmation towards self and others.  Patients went around the room and identified two positive things about themselves and two positive things about a peer in the room.  Patients reflected on how it felt to share something positive with others, to identify positive things about themselves, and to hear positive things from others/ Patients were encouraged to have a daily reflection of positive characteristics or circumstances.   Therapeutic Goals: Patients will verbalize two of their positive qualities Patients will demonstrate empathy for others by stating two positive qualities about a peer in the group Patients will verbalize their feelings when voicing positive self affirmations and when voicing positive affirmations of others Patients will discuss the potential positive impact on their wellness/recovery of focusing on positive traits of self and others.  Summary of Patient Progress:  Pt was observant and respectful during group, she didn't participate in discussion however she was actively listening and providing nonverbal communication during group.    Therapeutic Modalities:   Cognitive Behavioral Therapy Motivational Interviewing    Steffanie Dunn, Theresia Majors 02/08/2024  5:46 PM

## 2024-02-08 NOTE — Progress Notes (Signed)
 [facility] MD Progress Note  02/08/2024 10:20 AM Raven GIOVANNETTI  MRN:  [id number] Principal Problem: MDD (major depressive disorder), recurrent severe, without psychosis (HCC) Diagnosis: Principal Problem:   MDD (major depressive disorder), recurrent severe, without psychosis (HCC) Active Problems:   Dissociation   Generalized anxiety disorder with panic attacks   ID & Admission Information: Raven Evans is an 23 y.o. female who  has a past medical history of History of psychiatric hospitalization (2020), Major depressive disorder, recurrent severe without psychotic features (HCC), and Suicide attempt by acetaminophen overdose (HCC) (2020).  She presented on 02/07/2024 10:53 AM for MDD (major depressive disorder), recurrent severe, without psychosis (HCC).  She presented to the hospital for an aborted suicide attempt with a plan to overdose on acetaminophen, which she tried in the past.  She reported increasing depression and anxiety in the context of psychosocial stressors.  Subjective:   Case was discussed in the multidisciplinary team. MAR was reviewed and patient was compliant with medications, but required a great deal of encouragement.  Nursing noted that she was very reluctant to take anything.  There were no behavioral concerns.  No medication was required for agitation.  On exam today, the patient continues to report depressed mood and passive suicidal ideation.  She denies any problems or side effects associated with starting Lexapro.  Psychoeducation was provided on risks and benefits of the medication, and the patient stated she would like to continue with it.  She normally takes 10 mg of melatonin Gummies at home for sleep, but felt like she got adequate sleep with 5 mg of melatonin here in the hospital.  We discussed labs today, she has elevated cholesterol for which we discussed lifestyle modifications.  She has vitamin D insufficiency for which we discussed replacing with over-the-counter vitamin  D.  Psychoeducation was provided on steps to replace after leaving the hospital, and appropriate follow-up with PCP.  The role of vitamin D and mental health was discussed in detail.  The patient denies homicidal ideation, auditory hallucinations, or visual hallucinations.  We discussed continuing the treatment plan.   Past Psychiatric and Medical Medical History:  Past Medical History:  Diagnosis Date   History of psychiatric hospitalization 2020   Major depressive disorder, recurrent severe without psychotic features (HCC)    Suicide attempt by acetaminophen overdose (HCC) 2020    History reviewed. No pertinent surgical history.  Family History(Medical and Psychiatric):  Family History  Problem Relation Age of Onset   Depression Maternal Grandmother    COPD Maternal Grandfather    Hypertension Paternal Grandmother        Social History:  Social History   Substance and Sexual Activity  Alcohol Use No     Social History   Substance and Sexual Activity  Drug Use No    Social History   Socioeconomic History   Marital status: Single    Spouse name: Not on file   Number of children: Not on file   Years of education: Not on file   Highest education level: Not on file  Occupational History   Not on file  Tobacco Use   Smoking status: Never   Smokeless tobacco: Never  Vaping Use   Vaping status: Never Used  Substance and Sexual Activity   Alcohol use: No   Drug use: No   Sexual activity: Never  Other Topics Concern   Not on file  Social History Narrative   Not on file   Social Drivers of Health  Financial Resource Strain: Low Risk  (11/18/2018)   Overall Financial Resource Strain (CARDIA)    Difficulty of Paying Living Expenses: Not hard at all  Food Insecurity: Food Insecurity Present (02/07/2024)   Hunger Vital Sign    Worried About Running Out of Food in the Last Year: Sometimes true    Ran Out of Food in the Last Year: Sometimes true  Transportation  Needs: Unmet Transportation Needs (02/07/2024)   PRAPARE - Administrator, Civil Service (Medical): Yes    Lack of Transportation (Non-Medical): Yes  Physical Activity: Not on file  Stress: No Stress Concern Present (11/18/2018)   [employer] of Occupational Health - Occupational Stress Questionnaire    Feeling of Stress : Only a little  Social Connections: Unknown (03/19/2022)   Received from [facility]   Social Network    Social Network: Not on file        Current Medications: Current Facility-Administered Medications  Medication Dose Route Frequency Provider Last Rate Last Admin   acetaminophen (TYLENOL) tablet 650 mg  650 mg Oral Q6H PRN Sindy Guadeloupe, NP   650 mg at 02/07/24 1756   alum & mag hydroxide-simeth (MAALOX/MYLANTA) 200-200-20 MG/5ML suspension 30 mL  30 mL Oral Q4H PRN Sindy Guadeloupe, NP       escitalopram (LEXAPRO) tablet 10 mg  10 mg Oral QHS Golda Acre, MD   10 mg at 02/07/24 2123   hydrOXYzine (ATARAX) tablet 25 mg  25 mg Oral TID PRN Golda Acre, MD       magnesium hydroxide (MILK OF MAGNESIA) suspension 30 mL  30 mL Oral Daily PRN Sindy Guadeloupe, NP       melatonin tablet 5 mg  5 mg Oral QHS Golda Acre, MD   5 mg at 02/07/24 2124   OLANZapine (ZYPREXA) injection 10 mg  10 mg Intramuscular TID PRN Sindy Guadeloupe, NP       OLANZapine (ZYPREXA) injection 5 mg  5 mg Intramuscular TID PRN Sindy Guadeloupe, NP       OLANZapine zydis (ZYPREXA) disintegrating tablet 5 mg  5 mg Oral TID PRN Sindy Guadeloupe, NP       vitamin D3 (CHOLECALCIFEROL) tablet 2,000 Units  2,000 Units Oral Daily Golda Acre, MD        Lab Results:  Results for orders placed or performed during the hospital encounter of 02/07/24 (from the past 48 hours)  CBC with Differential/Platelet     Status: None   Collection Time: 02/07/24  6:36 PM  Result Value Ref Range   WBC 5.2 4.0 - 10.5 K/uL   RBC 4.09 3.87 - 5.11 MIL/uL   Hemoglobin 12.3 12.0 - 15.0 g/dL   HCT 16.1  09.6 - 04.5 %   MCV 94.4 80.0 - 100.0 fL   MCH 30.1 26.0 - 34.0 pg   MCHC 31.9 30.0 - 36.0 g/dL   RDW 40.9 81.1 - 91.4 %   Platelets 231 150 - 400 K/uL   nRBC 0.0 0.0 - 0.2 %   Neutrophils Relative % 59 %   Neutro Abs 3.1 1.7 - 7.7 K/uL   Lymphocytes Relative 33 %   Lymphs Abs 1.7 0.7 - 4.0 K/uL   Monocytes Relative 4 %   Monocytes Absolute 0.2 0.1 - 1.0 K/uL   Eosinophils Relative 2 %   Eosinophils Absolute 0.1 0.0 - 0.5 K/uL   Basophils Relative 1 %   Basophils Absolute 0.0 0.0 - 0.1 K/uL   Immature  Granulocytes 1 %   Abs Immature Granulocytes 0.03 0.00 - 0.07 K/uL    Comment: Performed at [facility], [address], [place], [place] [address]  Folate     Status: None   Collection Time: 02/07/24  6:36 PM  Result Value Ref Range   Folate 12.1 >5.9 ng/mL    Comment: Performed at [facility], [address] [place], [place] [address]  Lipid panel     Status: Abnormal   Collection Time: 02/07/24  6:36 PM  Result Value Ref Range   Cholesterol 220 (H) 0 - 200 mg/dL   Triglycerides 83 <829 mg/dL   HDL 80 >56 mg/dL   Total CHOL/HDL Ratio 2.8 RATIO   VLDL 17 0 - 40 mg/dL   LDL Cholesterol 213 (H) 0 - 99 mg/dL    Comment:        Total Cholesterol/HDL:CHD Risk Coronary Heart Disease Risk Table                     Men   Women  1/2 Average Risk   3.4   3.3  Average Risk       5.0   4.4  2 X Average Risk   9.6   7.1  3 X Average Risk  23.4   11.0        Use the calculated Patient Ratio above and the CHD Risk Table to determine the patient's CHD Risk.        ATP III CLASSIFICATION (LDL):  <100     mg/dL   Optimal  086-578  mg/dL   Near or Above                    Optimal  130-159  mg/dL   Borderline  469-629  mg/dL   High  >528     mg/dL   Very High Performed at [facility], [address], [place], [place] [address]   RPR     Status: None   Collection Time: 02/07/24  6:36 PM  Result Value Ref Range   RPR Ser  Ql NON REACTIVE NON REACTIVE    Comment: Performed at [facility], [address] [place], [place] [address]  TSH     Status: None   Collection Time: 02/07/24  6:36 PM  Result Value Ref Range   TSH 2.022 0.350 - 4.500 uIU/mL    Comment: Performed by a 3rd Generation assay with a functional sensitivity of <=0.01 uIU/mL. Performed at [facility], [address] [place], [place] [address]   Vitamin B12     Status: None   Collection Time: 02/07/24  6:36 PM  Result Value Ref Range   Vitamin B-12 316 180 - 914 pg/mL    Comment: (NOTE) This assay is not validated for testing neonatal or myeloproliferative syndrome specimens for Vitamin B12 levels. Performed at [facility], [address], [place], [place] [address]   VITAMIN D 25 Hydroxy (Vit-D Deficiency, Fractures)     Status: Abnormal   Collection Time: 02/07/24  6:36 PM  Result Value Ref Range   Vit D, 25-Hydroxy 22.44 (L) 30 - 100 ng/mL    Comment: (NOTE) Vitamin D deficiency has been defined by the Institute of Medicine  and an Endocrine Society practice guideline as a level of serum 25-OH  vitamin D less than 20 ng/mL (1,2). The Endocrine Society went on to  further define  vitamin D insufficiency as a level between 21 and 29  ng/mL (2).  1. IOM (Institute of Medicine). 2010. Dietary reference intakes for  calcium and D. [place]: [employer]. 2. Holick MF, Binkley [place], Bischoff-Ferrari HA, et al. Evaluation,  treatment, and prevention of vitamin D deficiency: an Endocrine  Society clinical practice guideline, JCEM. 2011 Jul; 96(7): 1911-30.  Performed at [facility], [address] [place], [place] [address]   HIV Antibody (routine testing w rflx)     Status: None   Collection Time: 02/07/24  6:36 PM  Result Value Ref Range   HIV Screen 4th Generation wRfx Non Reactive Non Reactive    Comment: Performed at [facility], [address]  [place], [place] [address]    Blood Alcohol level:  Lab Results  Component Value Date   [facility] <10 02/07/2024   ETH <10 11/18/2018    Metabolic Disorder Labs: Lab Results  Component Value Date   HGBA1C 5.7 (H) 11/25/2018   MPG 116.89 11/25/2018   No results found for: "PROLACTIN" Lab Results  Component Value Date   CHOL 220 (H) 02/07/2024   TRIG 83 02/07/2024   HDL 80 02/07/2024   CHOLHDL 2.8 02/07/2024   VLDL 17 02/07/2024   LDLCALC 123 (H) 02/07/2024   LDLCALC 99 11/25/2018    Physical Findings: AIMS:  , ,  ,  ,    CIWA:    COWS:     Psychiatric Specialty Exam:  Presentation  General Appearance: Appropriate for Environment  Eye Contact: Fair  Speech: Slow  Speech Volume: Decreased  Handedness: Right   Mood and Affect  Mood: Depressed; Anxious  Affect: Restricted; Congruent   Thought Process  Thought Processes: Linear  Descriptions of Associations: Intact  Orientation: Full (Time, Place and Person)  Thought Content: Logical  History of Schizophrenia/Schizoaffective disorder: No  Duration of Psychotic Symptoms: NA Hallucinations: Hallucinations: None  Ideas of Reference: None  Suicidal Thoughts: Suicidal Thoughts: No  Homicidal Thoughts: Homicidal Thoughts: No   Sensorium  Memory: Immediate Good; Recent Good  Judgment: Fair  Insight: Fair   Art therapist  Concentration: Fair  Attention Span: Good  Recall: Good  Fund of Knowledge: Good  Language: Good   Psychomotor Activity  Psychomotor Activity: Psychomotor Activity: Decreased   Assets  Assets: Communication Skills; Desire for Improvement; Housing   Sleep  Sleep: Sleep: Fair   Musculoskeletal: Strength & Muscle Tone: within normal limits Gait & Station: normal Patient leans: N/A   Physical Exam: General: Sitting comfortably. NAD. HEENT: Normocephalic, atraumatic, MMM, EMOI Lungs: no increased work of breathing noted Heart: no cyanosis Abdomen: Non  distended Musculoskeletal: FROM. No obvious deformities Skin: Warm, dry, intact. No rashes noted Neuro: No obvious focal deficits.  Gait and station are normal  Review of Systems  Constitutional: Negative.   HENT: Negative.    Eyes: Negative.   Respiratory: Negative.    Cardiovascular: Negative.   Gastrointestinal: Negative.   Genitourinary: Negative.   Skin: Negative.   Neurological: Negative.   Psychiatric/Behavioral:  Positive for depressed mood, suicidal ideation, anxiety.     Blood pressure 98/69, pulse 76, temperature 98.3 F (36.8 C), temperature source Oral, resp. rate 12, height 5\' 5"  (1.651 m), weight 56.7 kg, SpO2 99%. Body mass index is 20.8 kg/m.  ASSESSMENT: Raven Evans is an 23 y.o. female who  has a past medical history of History of psychiatric hospitalization (2020), Major depressive disorder, recurrent severe without psychotic features (  HCC), and Suicide attempt by acetaminophen overdose (HCC) (2020).  She presented on 02/07/2024 10:53 AM for MDD (major depressive disorder), recurrent severe, without psychosis (HCC).  She presented to the hospital for an aborted suicide attempt with a plan to overdose on acetaminophen, which she tried in the past.  She reported increasing depression and anxiety in the context of psychosocial stressors.  She denies a history of trauma, but may not be comfortable reporting.  Diagnoses / Active Problems: Patient Active Problem List   Diagnosis Date Noted   Dissociation 02/07/2024   Generalized anxiety disorder with panic attacks 02/07/2024   MDD (major depressive disorder), recurrent severe, without psychosis (HCC) 11/21/2018      PLAN: Safety and Monitoring:  -- Voluntary admission to inpatient psychiatric unit for safety, stabilization and treatment  -- Daily contact with patient to assess and evaluate symptoms and progress in treatment  -- Patient's case to be discussed in multi-disciplinary team meeting  -- Observation Level :  q15 minute checks  -- Vital signs:  q12 hours  -- Precautions: suicide, elopement, and assault  2. Psychiatric Diagnoses and Treatment:  Patient Active Problem List   Diagnosis Date Noted   Dissociation 02/07/2024   Generalized anxiety disorder with panic attacks 02/07/2024   MDD (major depressive disorder), recurrent severe, without psychosis (HCC) 11/21/2018     Scheduled Medications:  escitalopram  10 mg Oral QHS   melatonin  5 mg Oral QHS   cholecalciferol  2,000 Units Oral Daily    As Needed Medications: acetaminophen, alum & mag hydroxide-simeth, hydrOXYzine, magnesium hydroxide, OLANZapine, OLANZapine, OLANZapine zydis    3. Medical Issues Being Addressed:   -- Hypercholesterolemia (lifestyle modifications), vitamin D insufficiency as above  Labs reviewed, unremarkable with the exception of: Hypercholesterolemia, vitamin D insufficiency    4. Discharge Planning:   -- Social work and case management to assist with discharge planning and identification of hospital follow-up needs prior to discharge  -- Estimated LOS: Likely discharge early next week  -- Discharge Concerns: Need to establish a safety plan; Medication compliance and effectiveness  -- Discharge Goals: Return home with outpatient referrals for mental health follow-up including medication management/psychotherapy  5. Short Term Goals:  Improve ability to identify changes in lifestyle to reduce recurrence of condition, verbalize feelings, disclose and discuss suicidal ideas, demonstrate self-control, identify and develop effective coping behaviors, compliance with prescribed medications, identify triggers associated with substance abuse/mental health issues, participate in unit milieu and in scheduled group therapies   6. Long Term Goals: Improvement in symptoms so the patient is ready for discharge   --The risks/benefits/side-effects/alternatives to the medications above were discussed in detail with the  patient and time was given for questions. The patient provided informed consent.   -- Metabolic profile and EKG monitoring obtained while on an atypical antipsychotic and listed in the EHR    Total Time Spent in Direct Patient Care:  I personally spent 35 minutes on the unit in direct patient care. The direct patient care time included face-to-face time with the patient, reviewing the patient's chart, communicating with other professionals, and coordinating care. Greater than 50% of this time was spent in counseling or coordinating care with the patient regarding goals of hospitalization, psycho-education, and discharge planning needs.      Criss Alvine, MD Psychiatrist  02/08/2024, 10:20 AM   I certify that inpatient services furnished can reasonably be expected to improve the patient's condition.    Portions of this note were created using voice recognition  software. Minor syntax errors, grammatical content, spelling, or punctuation errors may have occurred unintentionally. Please notify the Thereasa Parkin if the meaning of any statement is unclear.

## 2024-02-08 NOTE — Group Note (Signed)
 Date:  02/08/2024 Time:  8:58 AM  Group Topic/Focus:  Goals Group:   The focus of this group is to help patients establish daily goals to achieve during treatment and discuss how the patient can incorporate goal setting into their daily lives to aide in recovery. Orientation:   The focus of this group is to educate the patient on the purpose and policies of crisis stabilization and provide a format to answer questions about their admission.  The group details unit policies and expectations of patients while admitted.    Participation Level:  Did Not Attend  Participation Quality:   n/a  Affect:   n/a  Cognitive:   n/a  Insight: None  Engagement in Group:   n/a  Modes of Intervention:   n/a  Additional Comments:    Raven Evans 02/08/2024, 8:58 AM

## 2024-02-08 NOTE — B[facility] Counselor (Signed)
 Adult Comprehensive Assessment  Patient ID: Raven Evans, female   DOB: 2001/02/04, 23 y.o.   MRN: [id number]  Information Source: Information source: Patient  Current Stressors:  Patient states their primary concerns and needs for treatment are:: "I want to try to leave without having thoughts of wanting to hurt myself" Patient states their goals for this hospitilization and ongoing recovery are:: "to get well" Educational / Learning stressors: none reported Employment / Job issues: "I have a good job but I just don't make enough money" Family Relationships: none reported Surveyor, quantity / Lack of resources (include bankruptcy): patient discussed financial strain-does not make the money she used to Housing / Lack of housing: Cannot afford to live on her own, moved out of her own apartment two weeks ago, it is probable that she will return to live with her grandmother Physical health (include injuries & life threatening diseases): High cholesterol Social relationships: none reported Substance abuse: none reported Bereavement / Loss: none reported  Living/Environment/Situation:  Living Arrangements: Other (Comment) (resides with grandmother) Living conditions (as described by patient or guardian): Comfortable, per patient she has her own room Who else lives in the home?: just grandmother How long has patient lived in current situation?: 2 weeks What is atmosphere in current home: Comfortable  Family History:  Marital status: Single Are you sexually active?: No What is your sexual orientation?: straight Has your sexual activity been affected by drugs, alcohol, medication, or emotional stress?: n/a Does patient have children?: No  Childhood History:  By whom was/is the patient raised?: Mother Description of patient's relationship with caregiver when they were a child: Patient confirms being close with mother Patient's description of current relationship with people who raised him/her: "she  is a really good mom, sometimes we butt heads " "Our relationship is better now that we don't live together" How were you disciplined when you got in trouble as a child/adolescent?: "few spankings, nothing crazy" Does patient have siblings?: Yes Number of Siblings: 4 Description of patient's current relationship with siblings: "really close" Has patient ever been sexually abused/assaulted/raped as an adolescent or adult?: No Was the patient ever a victim of a crime or a disaster?: No Witnessed domestic violence?: No Has patient been affected by domestic violence as an adult?: No  Education:  Highest grade of school patient has completed: 12th Currently a student?: No (currently applying for cosmetology school) Learning disability?: No  Employment/Work Situation:   Employment Situation: Employed Where is Patient Currently Employed?: [employer] How Long has Patient Been Employed?: 1 week Are You Satisfied With Your Job?: Yes Do You Work More Than One Job?: No Work Stressors: No identified work stressors.-just does not pay enough Patient's Job has Been Impacted by Current Illness: No What is the Longest Time Patient has Held a Job?: 1 1/2 to 2 years Where was the Patient Employed at that Time?: Once upon a child Has Patient ever Been in the [employer]?: No  Financial Resources:   Surveyor, quantity resources: Income from employment Does patient have a representative payee or guardian?: No  Alcohol/Substance Abuse:   What has been your use of drugs/alcohol within the last 12 months?: none reported Alcohol/Substance Abuse Treatment Hx: Denies past history Has alcohol/substance abuse ever caused legal problems?: No  Social Support System:   Patient's Community Support System: Good Describe Community Support System: Patient confirm that her mother and grandmother are her primary support Type of faith/religion: christian How does patient's faith help to cope with current illness?: Per patient,  her religion kept her from killing herself "I was a little bit fearful that I would go to he" "I felt like God was telling me to talk to my mom"  Leisure/Recreation:   Do You Have Hobbies?: Yes Leisure and Hobbies: working out, Administrator, sports  Strengths/Needs:   What is the patient's perception of their strengths?: "I like to help other people, teach and communicate" Patient states they can use these personal strengths during their treatment to contribute to their recovery: "I don't know" Patient states these barriers may affect/interfere with their treatment: none Patient states these barriers may affect their return to the community: none  Discharge Plan:   Currently receiving community mental health services: No Patient states concerns and preferences for aftercare planning are: christian therapist Patient states they will know when they are safe and ready for discharge when: "Honestly I don;t know" Does patient have access to transportation?: Yes Does patient have financial barriers related to discharge medications?: No Will patient be returning to same living situation after discharge?: Yes (Most likely return home with grandmother)  Summary/Recommendations:   Summary and Recommendations (to be completed by the evaluator): Patient is a 23 year old female admitted with suicide ideation related to recent financial stressors and additional psycho-social stresors. Patient now resides with her grandmother and has plan to return to her home.  Patient confirms having a supportive fammily, specifically her mother. She further confirms having a strong faith to rely on. Patient would benefit from group therapy, medication management, psychoeducation, crisis stabilization, peer support and discharge planning.  At discharge it is recommended that the patient adhere to the established aftercare plan.  Beatriz Quintela, [place]. 02/08/2024

## 2024-02-09 DIAGNOSIS — F332 Major depressive disorder, recurrent severe without psychotic features: Secondary | ICD-10-CM | POA: Diagnosis not present

## 2024-02-09 MED ORDER — MELATONIN 5 MG PO TABS
10.0000 mg | ORAL_TABLET | Freq: Every day | ORAL | Status: DC
  Administered 2024-02-09 – 2024-02-10 (×2): 10 mg via ORAL
  Filled 2024-02-09 (×4): qty 2

## 2024-02-09 NOTE — Group Note (Signed)
 Date:  02/09/2024 Time:  10:32 PM  Group Topic/Focus:  Wrap-Up Group:   The focus of this group is to help patients review their daily goal of treatment and discuss progress on daily workbooks.    Participation Level:  Active  Participation Quality:  Appropriate, Attentive, Sharing, and Supportive  Affect:  Appropriate  Cognitive:  Alert and Appropriate  Insight: Appropriate  Engagement in Group:  Engaged and Supportive  Modes of Intervention:  Discussion and Socialization  Additional Comments:  pt was attentive and supportive during tonight's wrap up group. Pt shared that it was nice to smile, laugh and talk with peers today.   Bing Plume D 02/09/2024, 10:32 PM

## 2024-02-09 NOTE — Progress Notes (Signed)
 D: Patient is alert, oriented, pleasant, and cooperative. Denies SI, HI, AVH, and verbally contracts for safety. Patient reports she slept good last night with sleeping medication. Patient reports her appetite as good, energy level as normal/high, and concentration as good. Patient rates her depression 0-1/10, hopelessness 0/10, and anxiety 0-1/10. Patient denies physical symptoms/pain.    A: Scheduled medications administered per MD order. Support provided. Patient educated on safety on the unit and medications. Routine safety checks every 15 minutes. Patient stated understanding to tell nurse about any new physical symptoms. Patient understands to tell staff of any needs.     R: No adverse drug reactions noted. Patient verbally contracts for safety. Patient remains safe at this time and will continue to monitor.    02/09/24 1000  Psych Admission Type (Psych Patients Only)  Admission Status Voluntary  Psychosocial Assessment  Patient Complaints Anxiety  Eye Contact Fair  Facial Expression Animated  Affect Appropriate to circumstance  Speech Logical/coherent  Interaction Guarded  Motor Activity Other (Comment) (WNL)  Appearance/Hygiene Unremarkable  Behavior Characteristics Cooperative  Mood Anxious  Thought Process  Coherency WDL  Content WDL  Delusions None reported or observed  Perception WDL  Hallucination None reported or observed  Judgment WDL  Confusion None  Danger to Self  Current suicidal ideation? Denies  Self-Injurious Behavior No self-injurious ideation or behavior indicators observed or expressed   Agreement Not to Harm Self Yes  Description of Agreement verbal  Danger to Others  Danger to Others None reported or observed

## 2024-02-09 NOTE — Plan of Care (Signed)
   Problem: Education: Goal: Emotional status will improve Outcome: Progressing Goal: Mental status will improve Outcome: Progressing Goal: Verbalization of understanding the information provided will improve Outcome: Progressing   Problem: Activity: Goal: Interest or engagement in activities will improve Outcome: Progressing   Problem: Coping: Goal: Ability to demonstrate self-control will improve Outcome: Progressing   Problem: Safety: Goal: Periods of time without injury will increase Outcome: Progressing

## 2024-02-09 NOTE — Progress Notes (Signed)
 [facility] MD Progress Note  02/09/2024 12:48 PM Raven Evans  MRN:  [id number] Subjective:   Raven Evans is an 23 y.o. female who  has a past medical history of History of psychiatric hospitalization (2020), Major depressive disorder, recurrent severe without psychotic features (HCC), and Suicide attempt by acetaminophen overdose (HCC) (2020).  She presented on 02/07/2024 10:53 AM for MDD (major depressive disorder), recurrent severe, without psychosis (HCC).  She presented to the hospital for an aborted suicide attempt with a plan to overdose on acetaminophen, which she tried in the past.  She reported increasing depression and anxiety in the context of psychosocial stressors.    Case was discussed in the multidisciplinary team. MAR was reviewed and patient was compliant with medications.  She did not receive any PRN medications yesterday.   Psychiatric Team made the following recommendations yesterday: None     On interview today patient reports she slept fair last night she reports she woke up multiple times throughout the night.  She reports her appetite is doing good.  She reports no SI, HI, or AVH.  She reports no Paranoia or Ideas of Reference.  She reports no issues with her medications.  She reports that the feels like the Lexapro is helping her even though she was anxious about taking medication.  She reports that she would like an increase in her Melatonin to help with her sleep.  She reports no other concerns at present.   Principal Problem: MDD (major depressive disorder), recurrent severe, without psychosis (HCC) Diagnosis: Principal Problem:   MDD (major depressive disorder), recurrent severe, without psychosis (HCC) Active Problems:   Dissociation   Generalized anxiety disorder with panic attacks  Total Time spent with patient:  I personally spent 35 minutes on the unit in direct patient care. The direct patient care time included face-to-face time with the patient, reviewing the  patient's chart, communicating with other professionals, and coordinating care. Greater than 50% of this time was spent in counseling or coordinating care with the patient regarding goals of hospitalization, psycho-education, and discharge planning needs.   Past Psychiatric History:      Past Medical History:  Diagnosis Date   History of psychiatric hospitalization 2020   Major depressive disorder, recurrent severe without psychotic features (HCC)     Suicide attempt by acetaminophen overdose (HCC) 2020        Past Medical History:  Past Medical History:  Diagnosis Date   History of psychiatric hospitalization 2020   Major depressive disorder, recurrent severe without psychotic features (HCC)    Suicide attempt by acetaminophen overdose (HCC) 2020   History reviewed. No pertinent surgical history. Family History:  Family History  Problem Relation Age of Onset   Depression Maternal Grandmother    COPD Maternal Grandfather    Hypertension Paternal Grandmother    Family Psychiatric  History:  Family History  Problem Relation Age of Onset   Depression Maternal Grandmother     COPD Maternal Grandfather     Hypertension Paternal Grandmother            Social History:  Social History   Substance and Sexual Activity  Alcohol Use No     Social History   Substance and Sexual Activity  Drug Use No    Social History   Socioeconomic History   Marital status: Single    Spouse name: Not on file   Number of children: Not on file   Years of education: Not on file  Highest education level: Not on file  Occupational History   Not on file  Tobacco Use   Smoking status: Never   Smokeless tobacco: Never  Vaping Use   Vaping status: Never Used  Substance and Sexual Activity   Alcohol use: No   Drug use: No   Sexual activity: Never  Other Topics Concern   Not on file  Social History Narrative   Not on file   Social Drivers of Health   Financial Resource Strain: Low  Risk  (11/18/2018)   Overall Financial Resource Strain (CARDIA)    Difficulty of Paying Living Expenses: Not hard at all  Food Insecurity: Food Insecurity Present (02/07/2024)   Hunger Vital Sign    Worried About Running Out of Food in the Last Year: Sometimes true    Ran Out of Food in the Last Year: Sometimes true  Transportation Needs: Unmet Transportation Needs (02/07/2024)   PRAPARE - Transportation    Lack of Transportation (Medical): Yes    Lack of Transportation (Non-Medical): Yes  Physical Activity: Not on file  Stress: No Stress Concern Present (11/18/2018)   [employer] of Occupational Health - Occupational Stress Questionnaire    Feeling of Stress : Only a little  Social Connections: Unknown (03/19/2022)   Received from [facility]   Social Network    Social Network: Not on file   Additional Social History:                         Sleep: Fair woke up multiple times throughout the night  Appetite:  Good  Current Medications: Current Facility-Administered Medications  Medication Dose Route Frequency Provider Last Rate Last Admin   acetaminophen (TYLENOL) tablet 650 mg  650 mg Oral Q6H PRN Sindy Guadeloupe, NP   650 mg at 02/07/24 1756   alum & mag hydroxide-simeth (MAALOX/MYLANTA) 200-200-20 MG/5ML suspension 30 mL  30 mL Oral Q4H PRN Sindy Guadeloupe, NP       escitalopram (LEXAPRO) tablet 10 mg  10 mg Oral QHS Golda Acre, MD   10 mg at 02/08/24 2106   hydrOXYzine (ATARAX) tablet 25 mg  25 mg Oral TID PRN Golda Acre, MD       magnesium hydroxide (MILK OF MAGNESIA) suspension 30 mL  30 mL Oral Daily PRN Sindy Guadeloupe, NP       melatonin tablet 10 mg  10 mg Oral QHS Lauro Franklin, MD       OLANZapine (ZYPREXA) injection 10 mg  10 mg Intramuscular TID PRN Sindy Guadeloupe, NP       OLANZapine (ZYPREXA) injection 5 mg  5 mg Intramuscular TID PRN Sindy Guadeloupe, NP       OLANZapine zydis (ZYPREXA) disintegrating tablet 5 mg  5 mg Oral TID PRN Sindy Guadeloupe, NP       vitamin D3 (CHOLECALCIFEROL) tablet 2,000 Units  2,000 Units Oral Daily Golda Acre, MD   2,000 Units at 02/09/24 1610    Lab Results:  Results for orders placed or performed during the hospital encounter of 02/07/24 (from the past 48 hours)  CBC with Differential/Platelet     Status: None   Collection Time: 02/07/24  6:36 PM  Result Value Ref Range   WBC 5.2 4.0 - 10.5 K/uL   RBC 4.09 3.87 - 5.11 MIL/uL   Hemoglobin 12.3 12.0 - 15.0 g/dL   HCT 96.0 45.4 - 09.8 %   MCV 94.4 80.0 - 100.0 fL  MCH 30.1 26.0 - 34.0 pg   MCHC 31.9 30.0 - 36.0 g/dL   RDW 16.1 09.6 - 04.5 %   Platelets 231 150 - 400 K/uL   nRBC 0.0 0.0 - 0.2 %   Neutrophils Relative % 59 %   Neutro Abs 3.1 1.7 - 7.7 K/uL   Lymphocytes Relative 33 %   Lymphs Abs 1.7 0.7 - 4.0 K/uL   Monocytes Relative 4 %   Monocytes Absolute 0.2 0.1 - 1.0 K/uL   Eosinophils Relative 2 %   Eosinophils Absolute 0.1 0.0 - 0.5 K/uL   Basophils Relative 1 %   Basophils Absolute 0.0 0.0 - 0.1 K/uL   Immature Granulocytes 1 %   Abs Immature Granulocytes 0.03 0.00 - 0.07 K/uL    Comment: Performed at [facility], [address], [place], [place] [address]  Folate     Status: None   Collection Time: 02/07/24  6:36 PM  Result Value Ref Range   Folate 12.1 >5.9 ng/mL    Comment: Performed at [facility], [address], [place], [place] [address]  Lipid panel     Status: Abnormal   Collection Time: 02/07/24  6:36 PM  Result Value Ref Range   Cholesterol 220 (H) 0 - 200 mg/dL   Triglycerides 83 <829 mg/dL   HDL 80 >56 mg/dL   Total CHOL/HDL Ratio 2.8 RATIO   VLDL 17 0 - 40 mg/dL   LDL Cholesterol 213 (H) 0 - 99 mg/dL    Comment:        Total Cholesterol/HDL:CHD Risk Coronary Heart Disease Risk Table                     Men   Women  1/2 Average Risk   3.4   3.3  Average Risk       5.0   4.4  2 X Average Risk   9.6   7.1  3 X Average Risk  23.4   11.0        Use the  calculated Patient Ratio above and the CHD Risk Table to determine the patient's CHD Risk.        ATP III CLASSIFICATION (LDL):  <100     mg/dL   Optimal  086-578  mg/dL   Near or Above                    Optimal  130-159  mg/dL   Borderline  469-629  mg/dL   High  >528     mg/dL   Very High Performed at [facility], [address], [place], [place] [address]   RPR     Status: None   Collection Time: 02/07/24  6:36 PM  Result Value Ref Range   RPR Ser Ql NON REACTIVE NON REACTIVE    Comment: Performed at [facility], [address], [place], [place] [address]  TSH     Status: None   Collection Time: 02/07/24  6:36 PM  Result Value Ref Range   TSH 2.022 0.350 - 4.500 uIU/mL    Comment: Performed by a 3rd Generation assay with a functional sensitivity of <=0.01 uIU/mL. Performed at [facility], [address] [place], [place] [address]   Vitamin B12     Status: None   Collection Time: 02/07/24  6:36 PM  Result Value Ref Range   Vitamin B-12 316 180 - 914 pg/mL  Comment: (NOTE) This assay is not validated for testing neonatal or myeloproliferative syndrome specimens for Vitamin B12 levels. Performed at [facility], [address] [place], [place] [address]   VITAMIN D 25 Hydroxy (Vit-D Deficiency, Fractures)     Status: Abnormal   Collection Time: 02/07/24  6:36 PM  Result Value Ref Range   Vit D, 25-Hydroxy 22.44 (L) 30 - 100 ng/mL    Comment: (NOTE) Vitamin D deficiency has been defined by the Institute of Medicine  and an Endocrine Society practice guideline as a level of serum 25-OH  vitamin D less than 20 ng/mL (1,2). The Endocrine Society went on to  further define vitamin D insufficiency as a level between 21 and 29  ng/mL (2).  1. IOM (Institute of Medicine). 2010. Dietary reference intakes for  calcium and D. [place]: [employer]. 2. Holick MF, Binkley [place],  Bischoff-Ferrari HA, et al. Evaluation,  treatment, and prevention of vitamin D deficiency: an Endocrine  Society clinical practice guideline, JCEM. 2011 Jul; 96(7): 1911-30.  Performed at [facility], [address] [place], [place] [address]   HIV Antibody (routine testing w rflx)     Status: None   Collection Time: 02/07/24  6:36 PM  Result Value Ref Range   HIV Screen 4th Generation wRfx Non Reactive Non Reactive    Comment: Performed at [facility], [address], [place], [place] [address]    Blood Alcohol level:  Lab Results  Component Value Date   [facility] <10 02/07/2024   ETH <10 11/18/2018    Metabolic Disorder Labs: Lab Results  Component Value Date   HGBA1C 5.7 (H) 11/25/2018   MPG 116.89 11/25/2018   No results found for: "PROLACTIN" Lab Results  Component Value Date   CHOL 220 (H) 02/07/2024   TRIG 83 02/07/2024   HDL 80 02/07/2024   CHOLHDL 2.8 02/07/2024   VLDL 17 02/07/2024   LDLCALC 123 (H) 02/07/2024   LDLCALC 99 11/25/2018    Physical Findings: AIMS:  , ,  ,  ,    CIWA:    COWS:     Musculoskeletal: Strength & Muscle Tone: within normal limits Gait & Station: normal Patient leans: N/A  Psychiatric Specialty Exam:  Presentation  General Appearance:  Appropriate for Environment; Casual  Eye Contact: Good  Speech: Clear and Coherent; Slow  Speech Volume: Decreased  Handedness: Right   Mood and Affect  Mood: Dysphoric  Affect: Congruent; Appropriate   Thought Process  Thought Processes: Coherent; Goal Directed  Descriptions of Associations:Intact  Orientation:Full (Time, Place and Person)  Thought Content:Logical; WDL  History of Schizophrenia/Schizoaffective disorder:No  Duration of Psychotic Symptoms:No data recorded Hallucinations:Hallucinations: None  Ideas of Reference:None  Suicidal Thoughts:Suicidal Thoughts: No  Homicidal Thoughts:Homicidal Thoughts: No   Sensorium  Memory: Immediate  Good; Recent Good  Judgment: Fair  Insight: Fair   Art therapist  Concentration: Good  Attention Span: Good  Recall: Good  Fund of Knowledge: Good  Language: Good   Psychomotor Activity  Psychomotor Activity: Psychomotor Activity: Normal   Assets  Assets: Communication Skills; Desire for Improvement; Physical Health; Resilience   Sleep  Sleep: Sleep: Fair (woke up multiple times)    Physical Exam: Physical Exam Vitals and nursing note reviewed.  Constitutional:      General: She is not in acute distress.    Appearance: Normal appearance. She is normal weight. She is not ill-appearing or toxic-appearing.  HENT:  Head: Normocephalic and atraumatic.  Pulmonary:     Effort: Pulmonary effort is normal.  Musculoskeletal:        General: Normal range of motion.  Neurological:     General: No focal deficit present.     Mental Status: She is alert.    Review of Systems  Respiratory:  Negative for cough and shortness of breath.   Cardiovascular:  Negative for chest pain.  Gastrointestinal:  Negative for abdominal pain, constipation, diarrhea, nausea and vomiting.  Neurological:  Negative for dizziness, weakness and headaches.  Psychiatric/Behavioral:  Negative for depression, hallucinations and suicidal ideas. The patient is not nervous/anxious.    Blood pressure 94/68, pulse 83, temperature 98.3 F (36.8 C), temperature source Oral, resp. rate 12, height 5\' 5"  (1.651 m), weight 56.7 kg, SpO2 99%. Body mass index is 20.8 kg/m.   Treatment Plan Summary: Daily contact with patient to assess and evaluate symptoms and progress in treatment and Medication management  Raven Evans is an 23 y.o. female who  has a past medical history of History of psychiatric hospitalization (2020), Major depressive disorder, recurrent severe without psychotic features (HCC), and Suicide attempt by acetaminophen overdose (HCC) (2020).  She presented on 02/07/2024 10:53  AM for MDD (major depressive disorder), recurrent severe, without psychosis (HCC).  She presented to the hospital for an aborted suicide attempt with a plan to overdose on acetaminophen, which she tried in the past.  She reported increasing depression and anxiety in the context of psychosocial stressors.     Alexiana appears to be responding well to the starting of Lexapro.  As she continues to have hesitancy about taking medication we will not further increase it at this time as she is responding to this dose.  She is having issues with waking up during the night so we will increase her Melatonin.  We will not make any other changes to her medications at this time.  We will continue to monitor.   MDD, Recurrent, Severe, w/out Psychosis: -Continue Lexapro 10 mg daily -Continue Agitation Protocol: Zyprexa   -Continue Vit D3 2000 units daily for deficiency -Increase Melatonin to 10 mg QHS for sleep -Continue PRN's: Tylenol, Maalox, Atarax, Milk of Magnesia    Medical Issues Being Addressed:              -- Hypercholesterolemia (lifestyle modifications), vitamin D insufficiency as above   Labs reviewed, unremarkable with the exception of: Hypercholesterolemia, vitamin D insufficiency       Discharge Planning:              -- Social work and case management to assist with discharge planning and identification of hospital follow-up needs prior to discharge             -- Estimated LOS: Likely discharge early next week             -- Discharge Concerns: Need to establish a safety plan; Medication compliance and effectiveness             -- Discharge Goals: Return home with outpatient referrals for mental health follow-up including medication management/psychotherapy   Short Term Goals:  Improve ability to identify changes in lifestyle to reduce recurrence of condition, verbalize feelings, disclose and discuss suicidal ideas, demonstrate self-control, identify and develop effective coping  behaviors, compliance with prescribed medications, identify triggers associated with substance abuse/mental health issues, participate in unit milieu and in scheduled group therapies    Long Term Goals: Improvement in symptoms so the  patient is ready for discharge     --The risks/benefits/side-effects/alternatives to the medications above were discussed in detail with the patient and time was given for questions. The patient provided informed consent.              -- Metabolic profile and EKG monitoring obtained while on an atypical antipsychotic and listed in the EHR    Lauro Franklin, MD 02/09/2024, 12:48 PM

## 2024-02-09 NOTE — Group Note (Signed)
 Date:  02/09/2024 Time:  9:06 AM  Group Topic/Focus:  Goals Group:   The focus of this group is to help patients establish daily goals to achieve during treatment and discuss how the patient can incorporate goal setting into their daily lives to aide in recovery. Orientation:   The focus of this group is to educate the patient on the purpose and policies of crisis stabilization and provide a format to answer questions about their admission.  The group details unit policies and expectations of patients while admitted.    Participation Level:  Did Not Attend  Participation Quality:  n/a  Affect:  n/a  Cognitive:  n/a  Insight: None  Engagement in Group:  n/a  Modes of Intervention:  n/a  Additional Comments:  n/a  Dimas Chyle Auther Lyerly 02/09/2024, 9:06 AM

## 2024-02-09 NOTE — Group Note (Signed)
 Date:  02/09/2024 Time:  9:53 AM  Group Topic/Focus:  The topic of this group is mindful breathing    Participation Level:  Did Not Attend

## 2024-02-09 NOTE — Plan of Care (Signed)
 Pt presents with flat expression and depressed/sad affect. Denies SI, HI, AVH, and pain. Rates anxiety 2/10 and reports that this is much improved from earlier in the day. Cooperative, calm, and pleasant in interactions with staff. Pt was observed in the dayroom attending and participating in evening group. Medication compliant with no adverse reactions. Safety checks maintained at q 15 minutes. Support, encouragement, and reassurance offered to the pt.   Problem: Education: Goal: Emotional status will improve Outcome: Progressing Goal: Mental status will improve Outcome: Progressing Goal: Verbalization of understanding the information provided will improve Outcome: Progressing   Problem: Activity: Goal: Interest or engagement in activities will improve Outcome: Progressing   Problem: Safety: Goal: Periods of time without injury will increase Outcome: Progressing

## 2024-02-10 ENCOUNTER — Encounter (HOSPITAL_COMMUNITY): Payer: Self-pay

## 2024-02-10 DIAGNOSIS — F332 Major depressive disorder, recurrent severe without psychotic features: Secondary | ICD-10-CM | POA: Diagnosis not present

## 2024-02-10 LAB — HEMOGLOBIN A1C
Hgb A1c MFr Bld: 5.5 % (ref 4.8–5.6)
Mean Plasma Glucose: 111 mg/dL

## 2024-02-10 NOTE — Group Note (Signed)
 Date:  02/10/2024 Time:  9:14 AM  Group Topic/Focus:  Goals Group:   The focus of this group is to help patients establish daily goals to achieve during treatment and discuss how the patient can incorporate goal setting into their daily lives to aide in recovery.    Participation Level:  Active  Participation Quality:  Appropriate  Erasmo Score 02/10/2024, 9:14 AM

## 2024-02-10 NOTE — Progress Notes (Signed)
   02/09/24 2050  Psych Admission Type (Psych Patients Only)  Admission Status Voluntary  Psychosocial Assessment  Patient Complaints None  Eye Contact Fair  Facial Expression Animated  Affect Appropriate to circumstance  Speech Logical/coherent  Interaction Assertive  Motor Activity Other (Comment) (WDL)  Appearance/Hygiene Unremarkable  Behavior Characteristics Cooperative;Appropriate to situation  Mood Euthymic  Thought Process  Coherency WDL  Content WDL  Delusions None reported or observed  Perception WDL  Hallucination None reported or observed  Judgment WDL  Confusion None  Danger to Self  Current suicidal ideation? Denies  Agreement Not to Harm Self Yes  Description of Agreement verbal  Danger to Others  Danger to Others None reported or observed

## 2024-02-10 NOTE — [facility] Group Notes (Signed)
 [facility] Group Notes:  (Nursing/MHT/Case Management/Adjunct)  Date:  02/10/2024  Time:  9:27 PM  Type of Therapy:  Psychoeducational Skills  Participation Level:  Active  Participation Quality:  Appropriate  Affect:  Depressed and Tearful  Cognitive:  Appropriate  Insight:  Good  Engagement in Group:  Engaged  Modes of Intervention:  Education  Summary of Progress/Problems: The patient attended the evening A.A. speaker'Evans meeting and shared quite a bit with the group.   Raven Evans 02/10/2024, 9:27 PM

## 2024-02-10 NOTE — Progress Notes (Signed)
   02/10/24 0800  Psych Admission Type (Psych Patients Only)  Admission Status Voluntary  Psychosocial Assessment  Patient Complaints None  Eye Contact Fair  Facial Expression Animated  Affect Appropriate to circumstance  Speech Logical/coherent  Interaction Assertive  Motor Activity Other (Comment) (WNL)  Appearance/Hygiene Unremarkable  Behavior Characteristics Appropriate to situation  Mood Pleasant  Thought Process  Coherency WDL  Content WDL  Delusions None reported or observed  Perception WDL  Hallucination None reported or observed  Judgment Limited  Confusion None  Danger to Self  Current suicidal ideation? Denies  Description of Suicide Plan no plan  Self-Injurious Behavior No self-injurious ideation or behavior indicators observed or expressed   Agreement Not to Harm Self Yes  Description of Agreement verbal  Danger to Others  Danger to Others None reported or observed

## 2024-02-10 NOTE — Plan of Care (Signed)
   Problem: Education: Goal: Emotional status will improve Outcome: Progressing Goal: Mental status will improve Outcome: Progressing   Problem: Activity: Goal: Interest or engagement in activities will improve Outcome: Progressing Goal: Sleeping patterns will improve Outcome: Progressing

## 2024-02-10 NOTE — Group Note (Signed)
 Therapy Group Note  Group Topic:Other  Group Date: 02/10/2024 Start Time: 1425 End Time: 1503 Facilitators: Ted Mcalpine, OT    The objective of today's group is to provide a comprehensive understanding of the concept of "motivation" and its role in human behavior and well-being. The content covers various theories of motivation, including intrinsic and extrinsic motivators, and explores the psychological mechanisms that drive individuals to achieve goals, overcome obstacles, and make decisions. By diving into real-world applications, the group aims to offer actionable strategies for enhancing motivation in different life domains, such as work, relationships, and personal growth.  Utilizing a multi-disciplinary approach, this group integrates insights from psychology, neuroscience, and behavioral economics to present a holistic view of motivation. The objective is not only to educate the audience about the complexities and driving forces behind motivation but also to equip them with practical tools and techniques to improve their own motivation levels. By the end of this multi-day group, patient's should have a well-rounded understanding of what motivates human actions and how to harness this knowledge for personal and professional betterment.     Participation Level: Engaged   Participation Quality: Independent   Behavior: Appropriate   Speech/Thought Process: Relevant   Affect/Mood: Appropriate   Insight: Fair   Judgement: Fair      Modes of Intervention: Education  Patient Response to Interventions:  Attentive   Plan: Continue to engage patient in OT groups 2 - 3x/week.  02/10/2024  Ted Mcalpine, OT  Kerrin Champagne, OT

## 2024-02-10 NOTE — BHH Group Notes (Signed)
 Spiritual care group on grief and loss facilitated by Chaplain Dyanne Carrel, [facility]  Group Goal: Support / Education around grief and loss  Members engage in facilitated group support and psycho-social education.  Group Description:  Following introductions and group rules, group members engaged in facilitated group dialogue and support around topic of loss, with particular support around experiences of loss in their lives. Group Identified types of loss (relationships / self / things) and identified patterns, circumstances, and changes that precipitate losses. Reflected on thoughts / feelings around loss, normalized grief responses, and recognized variety in grief experience. Group encouraged individual reflection on safe space and on the coping skills that they are already utilizing.  Group drew on Adlerian / Rogerian and narrative framework  Patient Progress: Raven Evans attended the end of group.  She was attentive and engaged for the portion of group she attended.

## 2024-02-10 NOTE — BH IP Treatment Plan (Signed)
 Interdisciplinary Treatment and Diagnostic Plan Update  02/10/2024 Time of Session: 1504 Raven Evans MRN: [id number]  Principal Diagnosis: MDD (major depressive disorder), recurrent severe, without psychosis (HCC)  Secondary Diagnoses: Principal Problem:   MDD (major depressive disorder), recurrent severe, without psychosis (HCC) Active Problems:   Dissociation   Generalized anxiety disorder with panic attacks   Current Medications:  Current Facility-Administered Medications  Medication Dose Route Frequency Provider Last Rate Last Admin   acetaminophen (TYLENOL) tablet 650 mg  650 mg Oral Q6H PRN Sindy Guadeloupe, NP   650 mg at 02/07/24 1756   alum & mag hydroxide-simeth (MAALOX/MYLANTA) 200-200-20 MG/5ML suspension 30 mL  30 mL Oral Q4H PRN Sindy Guadeloupe, NP       escitalopram (LEXAPRO) tablet 10 mg  10 mg Oral QHS Golda Acre, MD   10 mg at 02/09/24 2102   hydrOXYzine (ATARAX) tablet 25 mg  25 mg Oral TID PRN Golda Acre, MD       magnesium hydroxide (MILK OF MAGNESIA) suspension 30 mL  30 mL Oral Daily PRN Sindy Guadeloupe, NP   30 mL at 02/09/24 2200   melatonin tablet 10 mg  10 mg Oral QHS Lauro Franklin, MD   10 mg at 02/09/24 2102   OLANZapine (ZYPREXA) injection 10 mg  10 mg Intramuscular TID PRN Sindy Guadeloupe, NP       OLANZapine (ZYPREXA) injection 5 mg  5 mg Intramuscular TID PRN Sindy Guadeloupe, NP       OLANZapine zydis (ZYPREXA) disintegrating tablet 5 mg  5 mg Oral TID PRN Sindy Guadeloupe, NP       vitamin D3 (CHOLECALCIFEROL) tablet 2,000 Units  2,000 Units Oral Daily Golda Acre, MD   2,000 Units at 02/10/24 0815   PTA Medications: Medications Prior to Admission  Medication Sig Dispense Refill Last Dose/Taking   melatonin 3 MG TABS tablet Take 3 mg by mouth at bedtime as needed (sleep).   Past Month    Patient Stressors:    Patient Strengths:    Treatment Modalities: Medication Management, Group therapy, Case management,  1 to 1 session with  clinician, Psychoeducation, Recreational therapy.   Physician Treatment Plan for Primary Diagnosis: MDD (major depressive disorder), recurrent severe, without psychosis (HCC) Long Term Goal(s):     Short Term Goals:    Medication Management: Evaluate patient's response, side effects, and tolerance of medication regimen.  Therapeutic Interventions: 1 to 1 sessions, Unit Group sessions and Medication administration.  Evaluation of Outcomes: Not Progressing  Physician Treatment Plan for Secondary Diagnosis: Principal Problem:   MDD (major depressive disorder), recurrent severe, without psychosis (HCC) Active Problems:   Dissociation   Generalized anxiety disorder with panic attacks  Long Term Goal(s):     Short Term Goals:       Medication Management: Evaluate patient's response, side effects, and tolerance of medication regimen.  Therapeutic Interventions: 1 to 1 sessions, Unit Group sessions and Medication administration.  Evaluation of Outcomes: Not Progressing   RN Treatment Plan for Primary Diagnosis: MDD (major depressive disorder), recurrent severe, without psychosis (HCC) Long Term Goal(s): Knowledge of disease and therapeutic regimen to maintain health will improve  Short Term Goals: Ability to demonstrate self-control, Ability to participate in decision making will improve, Ability to identify and develop effective coping behaviors will improve, and Compliance with prescribed medications will improve  Medication Management: RN will administer medications as ordered by provider, will assess and evaluate patient's response and provide education to patient for prescribed  medication. RN will report any adverse and/or side effects to prescribing provider.  Therapeutic Interventions: 1 on 1 counseling sessions, Psychoeducation, Medication administration, Evaluate responses to treatment, Monitor vital signs and CBGs as ordered, Perform/monitor CIWA, COWS, AIMS and Fall Risk  screenings as ordered, Perform wound care treatments as ordered.  Evaluation of Outcomes: Not Progressing   LCSW Treatment Plan for Primary Diagnosis: MDD (major depressive disorder), recurrent severe, without psychosis (HCC) Long Term Goal(s): Safe transition to appropriate next level of care at discharge, Engage patient in therapeutic group addressing interpersonal concerns.  Short Term Goals: Engage patient in aftercare planning with referrals and resources, Increase social support, Increase ability to appropriately verbalize feelings, and Increase emotional regulation  Therapeutic Interventions: Assess for all discharge needs, 1 to 1 time with Social worker, Explore available resources and support systems, Assess for adequacy in community support network, Educate family and significant other(s) on suicide prevention, Complete Psychosocial Assessment, Interpersonal group therapy.  Evaluation of Outcomes: Not Progressing   Progress in Treatment: Attending groups: Yes. Participating in groups: Yes. Taking medication as prescribed: Yes. Toleration medication: Yes. Family/Significant other contact made: No, will contact:  Mother, Ward Givens Patient understands diagnosis: Yes. Discussing patient identified problems/goals with staff: Yes. Medical problems stabilized or resolved: Yes. Denies suicidal/homicidal ideation: Yes. Issues/concerns per patient self-inventory: No. Other: N.a  New problem(s) identified: No, Describe:  None  New Short Term/Long Term Goal(s): medication stabilization, elimination of SI thoughts, development of comprehensive mental wellness plan.   Patient Goals: "I just needed to get away"  Discharge Plan or Barriers: Patient recently admitted. CSW will continue to follow and assess for appropriate referrals and possible discharge planning.    Reason for Continuation of Hospitalization: Anxiety Depression Medication stabilization Other; describe Mood  stabilization, discharge planning  Estimated Length of Stay: 3-5 DAYS  Last 3 [place] Suicide Severity Risk Score: Flowsheet Row [facility] Most recent reading at 02/07/2024 11:00 AM ED from 02/07/2024 in [facility] Emergency Department [facility] Most recent reading at 02/07/2024 12:38 AM Ad[facility] (Discharged) [facility] Most recent reading at 11/20/2018 12:39 PM  C-SSRS RISK CATEGORY High Risk High Risk High Risk       Last PHQ 2/9 Scores:     No data to display          Scribe for Treatment Team: Jacinta Shoe, LCSW 02/10/2024 6:39 PM

## 2024-02-10 NOTE — Progress Notes (Signed)
 [facility] MD Progress Note  02/10/2024 2:14 PM JOANANN MIES  MRN:  [id number]  Reason for admission:   Raven Evans is an 23 y.o. female who  has a past medical history of History of psychiatric hospitalization (2020), Major depressive disorder, recurrent severe without psychotic features (HCC), and Suicide attempt by acetaminophen overdose (HCC) (2020).  She presented on 02/07/2024 10:53 AM for MDD (major depressive disorder), recurrent severe, without psychosis (HCC).  She presented to the hospital for an aborted suicide attempt with a plan to overdose on acetaminophen, which she tried in the past.  She reported increasing depression and anxiety in the context of psychosocial stressors.   Case was discussed in the multidisciplinary team. MAR was reviewed and patient was compliant with medications.  She did not receive any PRN medications yesterday.  Yesterday the psychiatry team made the following recommendations:: -Continue Lexapro 10 mg daily -Continue Agitation Protocol: Zyprexa so Dr. Enedina Finner still coming here you know  Today's assessment notes:  During assessment today, patient reports her mood is euthymic.  And with congruent affect.  Present pleasant, alert, and cooperative during assessment.  She is oriented to person, place, time, and situation.  Speech is clear, coherent with normal volume and pattern.  Cooperative during this exam and able to answer assessment questions appropriately.  Reports our goal today is planning on having good thoughts.  Report compliance with psychotropic medications without any side effects.  Objectively not responding to internal or external stimuli.  She denies SI, HI, or AVH.  Reports that anxiety is at manageable level Sleep is good and reports sleeping over 7 hours last night Appetite is improving Concentration is good  Energy level is adequate Denies suicidal thoughts. Denies suicidal intent and plan.  Denies having any HI.  Denies having psychotic symptoms.    Denies having side effects to current psychiatric medications.   We discussed compliance to current medication regimen.  Principal Problem: MDD (major depressive disorder), recurrent severe, without psychosis (HCC) Diagnosis: Principal Problem:   MDD (major depressive disorder), recurrent severe, without psychosis (HCC) Active Problems:   Dissociation   Generalized anxiety disorder with panic attacks  Total Time spent with patient:  I personally spent 35 minutes on the unit in direct patient care. The direct patient care time included face-to-face time with the patient, reviewing the patient's chart, communicating with other professionals, and coordinating care. Greater than 50% of this time was spent in counseling or coordinating care with the patient regarding goals of hospitalization, psycho-education, and discharge planning needs.   Past Psychiatric History:      Past Medical History:  Diagnosis Date   History of psychiatric hospitalization 2020   Major depressive disorder, recurrent severe without psychotic features (HCC)     Suicide attempt by acetaminophen overdose (HCC) 2020        Past Medical History:  Past Medical History:  Diagnosis Date   History of psychiatric hospitalization 2020   Major depressive disorder, recurrent severe without psychotic features (HCC)    Suicide attempt by acetaminophen overdose (HCC) 2020   History reviewed. No pertinent surgical history. Family History:  Family History  Problem Relation Age of Onset   Depression Maternal Grandmother    COPD Maternal Grandfather    Hypertension Paternal Grandmother    Family Psychiatric  History:  Family History  Problem Relation Age of Onset   Depression Maternal Grandmother     COPD Maternal Grandfather     Hypertension Paternal Grandmother  Social History:  Social History   Substance and Sexual Activity  Alcohol Use No     Social History   Substance and Sexual Activity   Drug Use No    Social History   Socioeconomic History   Marital status: Single    Spouse name: Not on file   Number of children: Not on file   Years of education: Not on file   Highest education level: Not on file  Occupational History   Not on file  Tobacco Use   Smoking status: Never   Smokeless tobacco: Never  Vaping Use   Vaping status: Never Used  Substance and Sexual Activity   Alcohol use: No   Drug use: No   Sexual activity: Never  Other Topics Concern   Not on file  Social History Narrative   Not on file   Social Drivers of Health   Financial Resource Strain: Low Risk  (11/18/2018)   Overall Financial Resource Strain (CARDIA)    Difficulty of Paying Living Expenses: Not hard at all  Food Insecurity: Food Insecurity Present (02/07/2024)   Hunger Vital Sign    Worried About Running Out of Food in the Last Year: Sometimes true    Ran Out of Food in the Last Year: Sometimes true  Transportation Needs: Unmet Transportation Needs (02/07/2024)   PRAPARE - Transportation    Lack of Transportation (Medical): Yes    Lack of Transportation (Non-Medical): Yes  Physical Activity: Not on file  Stress: No Stress Concern Present (11/18/2018)   [employer] of Occupational Health - Occupational Stress Questionnaire    Feeling of Stress : Only a little  Social Connections: Unknown (03/19/2022)   Received from [facility]   Social Network    Social Network: Not on file   Additional Social History:      Sleep: Fair woke up multiple times throughout the night  Appetite:  Good  Current Medications: Current Facility-Administered Medications  Medication Dose Route Frequency Provider Last Rate Last Admin   acetaminophen (TYLENOL) tablet 650 mg  650 mg Oral Q6H PRN Sindy Guadeloupe, NP   650 mg at 02/07/24 1756   alum & mag hydroxide-simeth (MAALOX/MYLANTA) 200-200-20 MG/5ML suspension 30 mL  30 mL Oral Q4H PRN Sindy Guadeloupe, NP       escitalopram (LEXAPRO) tablet 10 mg  10  mg Oral QHS Golda Acre, MD   10 mg at 02/09/24 2102   hydrOXYzine (ATARAX) tablet 25 mg  25 mg Oral TID PRN Golda Acre, MD       magnesium hydroxide (MILK OF MAGNESIA) suspension 30 mL  30 mL Oral Daily PRN Sindy Guadeloupe, NP   30 mL at 02/09/24 2200   melatonin tablet 10 mg  10 mg Oral QHS Lauro Franklin, MD   10 mg at 02/09/24 2102   OLANZapine (ZYPREXA) injection 10 mg  10 mg Intramuscular TID PRN Sindy Guadeloupe, NP       OLANZapine (ZYPREXA) injection 5 mg  5 mg Intramuscular TID PRN Sindy Guadeloupe, NP       OLANZapine zydis (ZYPREXA) disintegrating tablet 5 mg  5 mg Oral TID PRN Sindy Guadeloupe, NP       vitamin D3 (CHOLECALCIFEROL) tablet 2,000 Units  2,000 Units Oral Daily Golda Acre, MD   2,000 Units at 02/10/24 0815    Lab Results:  No results found for this or any previous visit (from the past 48 hours).   Blood Alcohol level:  Lab Results  Component Value Date   ETH <10 02/07/2024   ETH <10 11/18/2018    Metabolic Disorder Labs: Lab Results  Component Value Date   HGBA1C 5.5 02/07/2024   MPG 111 02/07/2024   MPG 116.89 11/25/2018   No results found for: "PROLACTIN" Lab Results  Component Value Date   CHOL 220 (H) 02/07/2024   TRIG 83 02/07/2024   HDL 80 02/07/2024   CHOLHDL 2.8 02/07/2024   VLDL 17 02/07/2024   LDLCALC 123 (H) 02/07/2024   LDLCALC 99 11/25/2018    Physical Findings: AIMS:  , ,  ,  ,    CIWA:    COWS:     Musculoskeletal: Strength & Muscle Tone: within normal limits Gait & Station: normal Patient leans: N/A  Psychiatric Specialty Exam:  Presentation  General Appearance:  Appropriate for Environment; Casual; Fairly Groomed  Eye Contact: Good  Speech: Clear and Coherent  Speech Volume: Normal  Handedness: Right   Mood and Affect  Mood: Euthymic  Affect: Appropriate; Congruent   Thought Process  Thought Processes: Coherent  Descriptions of Associations:Intact  Orientation:Full (Time, Place and  Person)  Thought Content:Logical  History of Schizophrenia/Schizoaffective disorder:No  Duration of Psychotic Symptoms:No data recorded Hallucinations:Hallucinations: None  Ideas of Reference:None  Suicidal Thoughts:Suicidal Thoughts: No  Homicidal Thoughts:Homicidal Thoughts: No   Sensorium  Memory: Immediate Good; Recent Good  Judgment: Good  Insight: Fair   Executive Functions  Concentration: Good  Attention Span: Good  Recall: Fair  Fund of Knowledge: Fair  Language: Good   Psychomotor Activity  Psychomotor Activity: Psychomotor Activity: Normal   Assets  Assets: Communication Skills; Desire for Improvement; Physical Health; Resilience   Sleep  Sleep: Sleep: Good Number of Hours of Sleep: 7    Physical Exam: Physical Exam Vitals and nursing note reviewed.  Constitutional:      General: She is not in acute distress.    Appearance: Normal appearance. She is normal weight. She is not ill-appearing or toxic-appearing.  HENT:     Head: Normocephalic and atraumatic.     Right Ear: External ear normal.     Left Ear: External ear normal.     Nose: Nose normal.     Mouth/Throat:     Mouth: Mucous membranes are moist.     Pharynx: Oropharynx is clear.  Eyes:     Extraocular Movements: Extraocular movements intact.  Cardiovascular:     Rate and Rhythm: Normal rate.     Pulses: Normal pulses.  Pulmonary:     Effort: Pulmonary effort is normal.  Abdominal:     Comments: Deferred  Genitourinary:    Comments: Deferred Musculoskeletal:        General: Normal range of motion.     Cervical back: Normal range of motion.  Skin:    General: Skin is warm.  Neurological:     General: No focal deficit present.     Mental Status: She is alert and oriented to person, place, and time.  Psychiatric:        Mood and Affect: Mood normal.        Behavior: Behavior normal.    Review of Systems  Constitutional:  Negative for chills and fever.   HENT:  Negative for sore throat.   Eyes:  Negative for blurred vision.  Respiratory:  Negative for cough, sputum production, shortness of breath and wheezing.   Cardiovascular:  Negative for chest pain and palpitations.  Gastrointestinal:  Negative for abdominal pain, constipation, diarrhea, heartburn, nausea and vomiting.  Genitourinary:  Negative for dysuria, frequency and urgency.  Musculoskeletal:  Negative for myalgias.  Skin:  Negative for itching and rash.  Neurological:  Negative for dizziness, weakness and headaches.  Endo/Heme/Allergies:        See allergy listing  Psychiatric/Behavioral:  Negative for depression, hallucinations and suicidal ideas. The patient is not nervous/anxious.    Blood pressure 97/73, pulse 80, temperature 98.3 F (36.8 C), temperature source Oral, resp. rate 14, height 5\' 5"  (1.651 m), weight 56.7 kg, SpO2 99%. Body mass index is 20.8 kg/m.   Treatment Plan Summary: Daily contact with patient to assess and evaluate symptoms and progress in treatment and Medication management  Wanita L Curl is an 23 y.o. female who  has a past medical history of History of psychiatric hospitalization (2020), Major depressive disorder, recurrent severe without psychotic features (HCC), and Suicide attempt by acetaminophen overdose (HCC) (2020).  She presented on 02/07/2024 10:53 AM for MDD (major depressive disorder), recurrent severe, without psychosis (HCC).  She presented to the hospital for an aborted suicide attempt with a plan to overdose on acetaminophen, which she tried in the past.  She reported increasing depression and anxiety in the context of psychosocial stressors.   [place] appears to be responding well to the starting of Lexapro.  As she continues to have hesitancy about taking medication we will not further increase it at this time as she is responding to this dose.  She is having issues with waking up during the night so we will increase her Melatonin.  We will  not make any other changes to her medications at this time.  We will continue to monitor.  MDD, Recurrent, Severe, w/out Psychosis: -Continue Lexapro 10 mg daily -Continue Agitation Protocol: Zyprexa  -Continue Vit D3 2000 units daily for deficiency -Continue Melatonin 10 mg QHS for sleep -Continue PRN's: Tylenol, Maalox, Atarax, Milk of Magnesia  Medical Issues Being Addressed:              -- Hypercholesterolemia (lifestyle modifications), vitamin D insufficiency as above   Labs reviewed, unremarkable with the exception of: Hypercholesterolemia, vitamin D insufficiency   Discharge Planning:              -- Social work and case management to assist with discharge planning and identification of hospital follow-up needs prior to discharge             -- Estimated LOS: Likely discharge early next week             -- Discharge Concerns: Need to establish a safety plan; Medication compliance and effectiveness             -- Discharge Goals: Return home with outpatient referrals for mental health follow-up including medication management/psychotherapy   Short Term Goals:  Improve ability to identify changes in lifestyle to reduce recurrence of condition, verbalize feelings, disclose and discuss suicidal ideas, demonstrate self-control, identify and develop effective coping behaviors, compliance with prescribed medications, identify triggers associated with substance abuse/mental health issues, participate in unit milieu and in scheduled group therapies    Long Term Goals: Improvement in symptoms so the patient is ready for discharge     --The risks/benefits/side-effects/alternatives to the medications above were discussed in detail with the patient and time was given for questions. The patient provided informed consent.              -- Metabolic profile and EKG monitoring obtained while on an atypical antipsychotic and listed in the EHR  Cecilie Lowers, FNP 02/10/2024, 2:14 PM Patient ID:  Aldona Lento, female   DOB: 2001-04-21, 21 y.o.   MRN: [id number]

## 2024-02-10 NOTE — Progress Notes (Signed)
 Patient denies SI/HI/AVH this morning. Pt rates their depression a 0/10 and anxiety a 0/10. Pt reports that they slept "fair" last night, reporting that she kept waking up. Pt reports that her goal for today is "to focus on positive thoughts and look on the brighter side of life". Pt has been interactive on the unit and participating in groups throughout the day. Pt has been calm and cooperative throughout the day. Patient has been compliant with medications and treatment plan. Q 15 minute safety checks are in place for patient's safety. Patient is currently safe on the unit.

## 2024-02-11 DIAGNOSIS — F332 Major depressive disorder, recurrent severe without psychotic features: Secondary | ICD-10-CM | POA: Diagnosis not present

## 2024-02-11 MED ORDER — HYDROXYZINE HCL 25 MG PO TABS: 25.0000 mg | ORAL_TABLET | Freq: Three times a day (TID) | ORAL | 0 refills | Status: AC | PRN

## 2024-02-11 MED ORDER — MELATONIN 10 MG PO TABS: 10.0000 mg | ORAL_TABLET | Freq: Every day | ORAL | 0 refills | Status: AC

## 2024-02-11 MED ORDER — VITAMIN D3 25 MCG PO TABS: 2000.0000 [IU] | ORAL_TABLET | Freq: Every day | ORAL | 0 refills | Status: AC

## 2024-02-11 MED ORDER — ESCITALOPRAM OXALATE 10 MG PO TABS: 10.0000 mg | ORAL_TABLET | Freq: Every day | ORAL | 0 refills | Status: AC

## 2024-02-11 NOTE — Group Note (Unsigned)
 Date:  02/11/2024 Time:  10:07 AM  Group Topic/Focus:  Goals Group:   The focus of this group is to help patients establish daily goals to achieve during treatment and discuss how the patient can incorporate goal setting into their daily lives to aide in recovery.     Participation Level:  {BHH PARTICIPATION ZOXWR:60454}  Participation Quality:  {BHH PARTICIPATION QUALITY:22265}  Affect:  {BHH AFFECT:22266}  Cognitive:  {BHH COGNITIVE:22267}  Insight: {BHH Insight2:20797}  Engagement in Group:  {BHH ENGAGEMENT IN UJWJX:91478}  Modes of Intervention:  {BHH MODES OF INTERVENTION:22269}  Additional Comments:  ***  Estill Dooms 02/11/2024, 10:07 AM

## 2024-02-11 NOTE — Group Note (Unsigned)
 Date:  02/11/2024 Time:  10:16 AM  Group Topic/Focus:  Goals Group:   The focus of this group is to help patients establish daily goals to achieve during treatment and discuss how the patient can incorporate goal setting into their daily lives to aide in recovery.     Participation Level:  {BHH PARTICIPATION ZOXWR:60454}  Participation Quality:  {BHH PARTICIPATION QUALITY:22265}  Affect:  {BHH AFFECT:22266}  Cognitive:  {BHH COGNITIVE:22267}  Insight: {BHH Insight2:20797}  Engagement in Group:  {BHH ENGAGEMENT IN UJWJX:91478}  Modes of Intervention:  {BHH MODES OF INTERVENTION:22269}  Additional Comments:  ***  Estill Dooms 02/11/2024, 10:16 AM

## 2024-02-11 NOTE — Group Note (Unsigned)
 Date:  02/11/2024 Time:  10:53 AM  Group Topic/Focus:  Goals Group:   The focus of this group is to help patients establish daily goals to achieve during treatment and discuss how the patient can incorporate goal setting into their daily lives to aide in recovery.     Participation Level:  {BHH PARTICIPATION LKGMW:10272}  Participation Quality:  {BHH PARTICIPATION QUALITY:22265}  Affect:  {BHH AFFECT:22266}  Cognitive:  {BHH COGNITIVE:22267}  Insight: {BHH Insight2:20797}  Engagement in Group:  {BHH ENGAGEMENT IN ZDGUY:40347}  Modes of Intervention:  {BHH MODES OF INTERVENTION:22269}  Additional Comments:  ***  Estill Dooms 02/11/2024, 10:53 AM

## 2024-02-11 NOTE — Group Note (Signed)
 Date:  02/11/2024 Time:  9:56 AM  Group Topic/Focus:  Goals Group:   The focus of this group is to help patients establish daily goals to achieve during treatment and discuss how the patient can incorporate goal setting into their daily lives to aide in recovery.    Participation Level:  Active  Participation Quality:  Attentive  Affect:  Appropriate  Cognitive:  Alert  Insight: Good  Engagement in Group:  Engaged  Modes of Intervention:  Education  Additional Comments:    Estill Dooms 02/11/2024, 9:56 AM

## 2024-02-11 NOTE — Group Note (Signed)
 Recreation Therapy Group Note   Group Topic:Animal Assisted Therapy   Group Date: 02/11/2024 Start Time: 0946 End Time: 1030 Facilitators: Jacquees Gongora-McCall, LRT,CTRS Location: [address] Dayroom   Animal-Assisted Activity (AAA) Program Checklist/Progress Notes Patient Eligibility Criteria Checklist & Daily Group note for Rec Tx Intervention  AAA/T Program Assumption of Risk Form signed by Patient/ or Parent Legal Guardian Yes  Patient is free of allergies or severe asthma Yes  Patient reports no fear of animals Yes  Patient reports no history of cruelty to animals Yes  Patient understands his/her participation is voluntary Yes  Patient washes hands before animal contact Yes  Patient washes hands after animal contact Yes  Behavioral Response: Engaged   Education: Charity fundraiser, Appropriate Animal Interaction   Education Outcome: Acknowledges education.    Affect/Mood: Appropriate   Participation Level: Engaged   Participation Quality: Independent   Behavior: Appropriate   Speech/Thought Process: Focused   Insight: Good   Judgement: Good   Modes of Intervention: Teaching laboratory technician   Patient Response to Interventions:  Engaged   Education Outcome:  In group clarification offered    Clinical Observations/Individualized Feedback: Patient attended session and interacted appropriately with therapy dog and peers. Patient asked appropriate questions about therapy dog and his training. Patient shared stories about their pets at home with group.      Plan: Continue to engage patient in RT group sessions 2-3x/week.   Kjersti Dittmer-McCall, LRT,CTRS 02/11/2024 1:39 PM

## 2024-02-11 NOTE — BHH Suicide Risk Assessment (Signed)
 BHH INPATIENT:  Family/Significant Other Suicide Prevention Education  Suicide Prevention Education:  Education Completed; Altamease Oiler [phone], has been identified by the patient as the family member/significant other with whom the patient will be residing, and identified as the person(s) who will aid the patient in the event of a mental health crisis (suicidal ideations/suicide attempt).  With written consent from the patient, the family member/significant other has been provided the following suicide prevention education, prior to the and/or following the discharge of the patient.  The suicide prevention education provided includes the following: Suicide risk factors Suicide prevention and interventions National Suicide Hotline telephone number [facility] assessment telephone number [facility] Emergency Assistance 911 [facility] and/or Residential Mobile Crisis Unit telephone number  Request made of family/significant other to: Remove weapons (e.g., guns, rifles, knives), all items previously/currently identified as safety concern.   Remove drugs/medications (over-the-counter, prescriptions, illicit drugs), all items previously/currently identified as a safety concern.  The family member/significant other verbalizes understanding of the suicide prevention education information provided. The family member/significant other agrees to remove the items of safety concern listed above. CSW provided Pt with suicide prevention information pamphlet.  Joelyn Oms Tonette Koehne, LCSW 02/11/2024, 1:48 PM

## 2024-02-11 NOTE — Group Note (Unsigned)
 Date:  02/11/2024 Time:  11:00 AM  Group Topic/Focus:  Goals Group:   The focus of this group is to help patients establish daily goals to achieve during treatment and discuss how the patient can incorporate goal setting into their daily lives to aide in recovery.     Participation Level:  {BHH PARTICIPATION ZOXWR:60454}  Participation Quality:  {BHH PARTICIPATION QUALITY:22265}  Affect:  {BHH AFFECT:22266}  Cognitive:  {BHH COGNITIVE:22267}  Insight: {BHH Insight2:20797}  Engagement in Group:  {BHH ENGAGEMENT IN UJWJX:91478}  Modes of Intervention:  {BHH MODES OF INTERVENTION:22269}  Additional Comments:  ***  Estill Dooms 02/11/2024, 11:00 AM

## 2024-02-11 NOTE — Group Note (Signed)
 LCSW Group Therapy Note   Group Date: 02/11/2024 Start Time: 1100 End Time: 1200   Participation:  patient attended 75 % of the group session.   She listened and was respectful but didn't participate in the discussion.    Type of Therapy:  Group Therapy  Title:  "Shining from Within: Confidence and Self-Makya Journey"  Objective:  The focus of today's session is to explore how confidence and self-Delanda can be nurtured over time through self-compassion, recognizing strengths, and taking small, intentional steps.  Goals: Foster self-Teneka and acceptance by embracing strengths and imperfections. Develop confidence through actionable steps and mindset shifts. Practice patience during personal growth, acknowledging setbacks as part of the journey.  Summary:  This session focused on self-Kinga as the foundation for confidence.  Participants practiced helpful self-talk, identified strengths, set small goals, and reflected on achievements and social support.  It emphasized that building confidence is a continuous process requiring patience and self-care.  Therapeutic Modalities: Cognitive Behavioral Therapy (CBT): Used to challenge and replace unhelpful self-talk with more supportive thoughts, enhancing self-esteem. Mindfulness and Self-Compassion Practices: Encourages reflection on strengths, gratitude, and the creation of a positive environment to foster a sense of well-being.   Alla Feeling, LCSWA 02/11/2024  12:41 PM

## 2024-02-11 NOTE — Progress Notes (Signed)
   02/10/24 2130  Psych Admission Type (Psych Patients Only)  Admission Status Voluntary  Psychosocial Assessment  Patient Complaints None  Eye Contact Fair  Facial Expression Animated  Affect Appropriate to circumstance  Speech Logical/coherent  Interaction Assertive  Motor Activity Other (Comment) (WNL)  Appearance/Hygiene Unremarkable  Behavior Characteristics Appropriate to situation  Mood Pleasant  Thought Process  Coherency WDL  Content WDL  Delusions None reported or observed  Perception WDL  Hallucination None reported or observed  Judgment Limited  Confusion None  Danger to Self  Current suicidal ideation? Denies  Description of Suicide Plan No Plan  Self-Injurious Behavior No self-injurious ideation or behavior indicators observed or expressed   Agreement Not to Harm Self Yes  Description of Agreement Verbal  Danger to Others  Danger to Others None reported or observed

## 2024-02-11 NOTE — Progress Notes (Signed)
 Discharge Note:  pt awake and alert.  Bright affect and mood.  Pt denies SI, HI or AVH. Pt given AVS along with follow up instructions Si safety plan and transition report.  Along with prescription call in information.  Pt verbalized understanding. She was given belongings from locker 43 and escorted to lobby. Pt left with her mother.  No distress noted.

## 2024-02-11 NOTE — Group Note (Unsigned)
 Date:  02/11/2024 Time:  10:45 AM  Group Topic/Focus:  Goals Group:   The focus of this group is to help patients establish daily goals to achieve during treatment and discuss how the patient can incorporate goal setting into their daily lives to aide in recovery.     Participation Level:  {BHH PARTICIPATION ZOXWR:60454}  Participation Quality:  {BHH PARTICIPATION QUALITY:22265}  Affect:  {BHH AFFECT:22266}  Cognitive:  {BHH COGNITIVE:22267}  Insight: {BHH Insight2:20797}  Engagement in Group:  {BHH ENGAGEMENT IN UJWJX:91478}  Modes of Intervention:  {BHH MODES OF INTERVENTION:22269}  Additional Comments:  ***  Raven Evans 02/11/2024, 10:45 AM

## 2024-02-11 NOTE — BHH Suicide Risk Assessment (Signed)
 Suicide Risk Assessment  Discharge Assessment    [facility] Discharge Suicide Risk Assessment   Principal Problem: MDD (major depressive disorder), recurrent severe, without psychosis (HCC) Discharge Diagnoses: Principal Problem:   MDD (major depressive disorder), recurrent severe, without psychosis (HCC) Active Problems:   Dissociation   Generalized anxiety disorder with panic attacks  Reason for admission:  Raven Evans is a 23 y.o. who  has a past medical history of History of psychiatric hospitalization (2020), Major depressive disorder, recurrent severe without psychotic features (HCC), and Suicide attempt by acetaminophen overdose (HCC) (2020).  She presented to [facility] for MDD (major depressive disorder), recurrent severe, without psychosis (HCC).  She presents for suicidal ideation and aborted suicide attempt in the context of worsening depression and increased psychosocial stressors.  She appears to be a fair historian.    Total Time spent with patient: 45 minutes  Musculoskeletal: Strength & Muscle Tone: within normal limits Gait & Station: normal Patient leans: N/A  Psychiatric Specialty Exam  Presentation  General Appearance:  Appropriate for Environment; Casual  Eye Contact: Good  Speech: Clear and Coherent  Speech Volume: Normal  Handedness: Right   Mood and Affect  Mood: Euthymic  Duration of Depression Symptoms: Greater than two weeks  Affect: Appropriate; Congruent   Thought Process  Thought Processes: Coherent  Descriptions of Associations:Intact  Orientation:Full (Time, Place and Person)  Thought Content:Logical  History of Schizophrenia/Schizoaffective disorder:No  Duration of Psychotic Symptoms:No data recorded Hallucinations:Hallucinations: None  Ideas of Reference:None  Suicidal Thoughts:Suicidal Thoughts: No  Homicidal Thoughts:Homicidal Thoughts: No   Sensorium  Memory: Immediate Good; Recent  Good  Judgment: Good  Insight: Fair   Executive Functions  Concentration: Good  Attention Span: Good  Recall: Fair  Fund of Knowledge: Fair  Language: Good   Psychomotor Activity  Psychomotor Activity: Psychomotor Activity: Normal   Assets  Assets: Communication Skills; Physical Health; Resilience   Sleep  Sleep: Sleep: Good Number of Hours of Sleep: 7   Physical Exam: Physical Exam Vitals and nursing note reviewed.  Constitutional:      Appearance: Normal appearance.  HENT:     Head: Normocephalic.     Right Ear: External ear normal.     Left Ear: External ear normal.     Nose: Nose normal.     Mouth/Throat:     Mouth: Mucous membranes are moist.     Pharynx: Oropharynx is clear.  Eyes:     Extraocular Movements: Extraocular movements intact.  Cardiovascular:     Rate and Rhythm: Normal rate.     Pulses: Normal pulses.  Pulmonary:     Effort: Pulmonary effort is normal.  Abdominal:     Comments: Deferred  Genitourinary:    Comments: Deferred Musculoskeletal:        General: Normal range of motion.     Cervical back: Normal range of motion.  Skin:    General: Skin is warm.  Neurological:     General: No focal deficit present.     Mental Status: She is alert and oriented to person, place, and time.  Psychiatric:        Mood and Affect: Mood normal.        Behavior: Behavior normal.        Thought Content: Thought content normal.    Review of Systems  Constitutional:  Negative for chills and fever.  HENT:  Negative for sore throat.   Eyes:  Negative for blurred vision.  Respiratory:  Negative for cough.  Cardiovascular:  Negative for chest pain and palpitations.  Gastrointestinal:  Negative for abdominal pain, constipation, diarrhea, heartburn, nausea and vomiting.  Genitourinary:  Negative for dysuria, frequency and urgency.  Musculoskeletal:  Negative for myalgias.  Skin:  Negative for rash.  Neurological:  Negative for  dizziness and headaches.  Endo/Heme/Allergies:        See allergy listing  Psychiatric/Behavioral:  Positive for depression (Improved with medication). Negative for hallucinations, substance abuse and suicidal ideas. The patient is nervous/anxious (Improved with medication). The patient does not have insomnia.    Blood pressure 111/64, pulse 70, temperature 98.4 F (36.9 C), temperature source Oral, resp. rate 16, height 5\' 5"  (1.651 m), weight 56.7 kg, SpO2 98%. Body mass index is 20.8 kg/m.  Mental Status Per Nursing Assessment::   On Admission:  Suicidal ideation indicated by patient  Demographic Factors:  Adolescent or young adult, Low socioeconomic status, and Unemployed  Loss Factors: Financial problems/change in socioeconomic status  Historical Factors: Prior suicide attempts, Family history of mental illness or substance abuse, and Impulsivity  Risk Reduction Factors:   Positive social support, Positive therapeutic relationship, and Positive coping skills or problem solving skills  Continued Clinical Symptoms:  Depression:   Impulsivity Recent sense of peace/wellbeing More than one psychiatric diagnosis Previous Psychiatric Diagnoses and Treatments  Cognitive Features That Contribute To Risk:  Polarized thinking    Suicide Risk:  Mild:  Suicidal ideation of limited frequency, intensity, duration, and specificity.  There are no identifiable plans, no associated intent, mild dysphoria and related symptoms, good self-control (both objective and subjective assessment), few other risk factors, and identifiable protective factors, including available and accessible social support.   Follow-up Information     [facility] Follow up on 02/18/2024.   Specialty: Psychology Why: A referral has been made on your behalf to this provider for [facility] therapy services.  Please call on 02/18/24 at 9:00 am to personally schedule an appointment for therapy services  with Ms. Horton, or Ms. Fortier. Contact information: [address] [place] [address] [phone]         [facility]. Schedule an appointment as soon as possible for a visit.   Why: You may also call this provider to schedule a Virtual appointment for [facility] therapy services.  Go to website:  [email or web address] Contact informationClaris [place]ower, [place] [address]  P: [phone]        [facility] Go on 03/09/2024.   Why: You have an appointment for medication management services on 03/09/24 at 3:00 pm. The appointment will be held in person, but you may call to switch to Virtual. Contact information: [address] [place] [address] [phone]                 Plan Of Care/Follow-up recommendations:  Discharge Recommendations:    The patient is being discharged with her family.   Patient is to take her discharge medications as ordered. ?See follow up above.   We recommend that she participates in individual therapy to target uncontrollable agitation and substance abuse.    We recommend that she participates in therapy to target the conflict with her family, to improve communication skills and conflict resolution skills. ?patient is to initiate/implement a contingency based behavioral model to address patient's behavior.   We recommend that she gets AIMS scale, height, weight, blood pressure, fasting lipid panel, fasting blood sugar in three months from discharge if she's on atypical antipsychotics.  Patient will benefit from monitoring of recurrent suicidal ideation since patient is on antidepressant medication.   The patient should abstain from all illicit substances and alcohol.   If the patient's symptoms worsen or do not continue to improve or if the patient becomes actively suicidal or homicidal then it is recommended that the patient return to the closest hospital emergency room or call 911  for further evaluation and treatment. National Suicide Prevention Lifeline 1800-SUICIDE or [phone].   Please follow up with your primary medical doctor for all other medical needs.    The patient has been educated on the possible side effects to medications and she/her guardian is to contact a medical professional and inform outpatient provider of any new side effects of medication.   She is to take regular diet and activity as tolerated. ?Will benefit from moderate daily exercise.   Patient was educated about removing/locking any firearms, medications or dangerous products from the home.      Activity:  As tolerated   Diet:  Regular Diet   Cecilie Lowers, FNP 02/11/2024, 12:41 PM

## 2024-02-11 NOTE — Plan of Care (Signed)
   Problem: Education: Goal: Emotional status will improve Outcome: Progressing   Problem: Activity: Goal: Interest or engagement in activities will improve Outcome: Progressing

## 2024-02-11 NOTE — Discharge Summary (Signed)
 Physician Discharge Summary Note  Patient:  Raven Evans is an 23 y.o., female MRN:  [id number] DOB:  [birth year] Patient phone:  [phone] (home)  Patient address:   [address] [place] [address],  Total Time spent with patient: 45 minutes  Date of Admission:  02/07/2024 Date of Discharge:   02/11/2024  Reason for Admission:  Raven Evans is a 23 y.o. who  has a past medical history of History of psychiatric hospitalization (2020), Major depressive disorder, recurrent severe without psychotic features (HCC), and Suicide attempt by acetaminophen overdose (HCC) (2020).  She presented to [facility] for MDD (major depressive disorder), recurrent severe, without psychosis (HCC).  She presents for suicidal ideation and aborted suicide attempt in the context of worsening depression and increased psychosocial stressors.  She appears to be a fair historian.   Principal Problem: MDD (major depressive disorder), recurrent severe, without psychosis (HCC) Discharge Diagnoses: Principal Problem:   MDD (major depressive disorder), recurrent severe, without psychosis (HCC) Active Problems:   Dissociation   Generalized anxiety disorder with panic attacks   Past Psychiatric History:  She  has a past medical history of History of psychiatric hospitalization (2020), Major depressive disorder, recurrent severe without psychotic features (HCC), and Suicide attempt by acetaminophen overdose (HCC) (2020).   Past Medical History:  Past Medical History:  Diagnosis Date   History of psychiatric hospitalization 2020   Major depressive disorder, recurrent severe without psychotic features (HCC)    Suicide attempt by acetaminophen overdose (HCC) 2020   History reviewed. No pertinent surgical history. Family History:  Family History  Problem Relation Age of Onset   Depression Maternal Grandmother    COPD Maternal Grandfather    Hypertension Paternal Grandmother    Family Psychiatric  History: See  H&P  Social History:  Social History   Substance and Sexual Activity  Alcohol Use No     Social History   Substance and Sexual Activity  Drug Use No    Social History   Socioeconomic History   Marital status: Single    Spouse name: Not on file   Number of children: Not on file   Years of education: Not on file   Highest education level: Not on file  Occupational History   Not on file  Tobacco Use   Smoking status: Never   Smokeless tobacco: Never  Vaping Use   Vaping status: Never Used  Substance and Sexual Activity   Alcohol use: No   Drug use: No   Sexual activity: Never  Other Topics Concern   Not on file  Social History Narrative   Not on file   Social Drivers of Health   Financial Resource Strain: Low Risk  (11/18/2018)   Overall Financial Resource Strain (CARDIA)    Difficulty of Paying Living Expenses: Not hard at all  Food Insecurity: Food Insecurity Present (02/07/2024)   Hunger Vital Sign    Worried About Running Out of Food in the Last Year: Sometimes true    Ran Out of Food in the Last Year: Sometimes true  Transportation Needs: Unmet Transportation Needs (02/07/2024)   PRAPARE - Transportation    Lack of Transportation (Medical): Yes    Lack of Transportation (Non-Medical): Yes  Physical Activity: Not on file  Stress: No Stress Concern Present (11/18/2018)   [employer] of Occupational Health - Occupational Stress Questionnaire    Feeling of Stress : Only a little  Social Connections: Unknown (03/19/2022)   [facility]  Social Network    Social Network: Not on file   Hospital Course:  During the patient's hospitalization, patient had extensive initial psychiatric evaluation, and follow-up psychiatric evaluations every day.  Psychiatric diagnoses provided upon initial assessment:  Diagnosis:  Principal Problem:   MDD (major depressive disorder), recurrent severe, without psychosis (HCC) Active Problems:   Dissociation    Generalized anxiety disorder with panic attacks  Patient's psychiatric medications were adjusted on admission:   escitalopram  10 mg Oral QHS   melatonin  5 mg Oral QHS   During the hospitalization, other adjustments were made to the patient's psychiatric medication regimen:  Hydroxyzine 25 mg p.o. 3 times daily as needed for anxiety was initiated  Patient's care was discussed during the interdisciplinary team meeting every day during the hospitalization.  The patient denies having side effects to prescribed psychiatric medication.  Gradually, patient started adjusting to milieu. The patient was evaluated each day by a clinical provider to ascertain response to treatment. Improvement was noted by the patient's report of decreasing symptoms, improved sleep and appetite, affect, medication tolerance, behavior, and participation in unit programming.  Patient was asked each day to complete a self inventory noting mood, mental status, pain, new symptoms, anxiety and concerns.    Symptoms were reported as significantly decreased or resolved completely by discharge.   On day of discharge, the patient reports that their mood is stable. The patient denied having suicidal thoughts for more than 48 hours prior to discharge.  Patient denies having homicidal thoughts.  Patient denies having auditory hallucinations.  Patient denies any visual hallucinations or other symptoms of psychosis. The patient was motivated to continue taking medication with a goal of continued improvement in mental health.   The patient reports their target psychiatric symptoms of depression responded well to the psychiatric medications, and the patient reports overall benefit other psychiatric hospitalization. Supportive psychotherapy was provided to the patient. The patient also participated in regular group therapy while hospitalized. Coping skills, problem solving as well as relaxation therapies were also part of the unit  programming.  Labs were reviewed with the patient, and abnormal results were discussed with the patient.  The patient is able to verbalize their individual safety plan to this provider.  # It is recommended to the patient to continue psychiatric medications as prescribed, after discharge from the hospital.    # It is recommended to the patient to follow up with your outpatient psychiatric provider and PCP.  # It was discussed with the patient, the impact of alcohol, drugs, tobacco have been there overall psychiatric and medical wellbeing, and total abstinence from substance use was recommended the patient.ed.  # Prescriptions provided or sent directly to preferred pharmacy at discharge. Patient agreeable to plan. Given opportunity to ask questions. Appears to feel comfortable with discharge.    # In the event of worsening symptoms, the patient is instructed to call the crisis hotline, 911 and or go to the nearest ED for appropriate evaluation and treatment of symptoms. To follow-up with primary care provider for other medical issues, concerns and or health care needs  # Patient was discharged to home with her grandpa with a plan to follow up as noted below.   Physical Findings: AIMS:  , ,  ,  ,    CIWA:    COWS:     Musculoskeletal: Strength & Muscle Tone: within normal limits Gait & Station: normal Patient leans: N/A   Psychiatric Specialty Exam:  Presentation  General Appearance:  Appropriate for Environment; Casual  Eye Contact: Good  Speech: Clear and Coherent  Speech Volume: Normal  Handedness: Right   Mood and Affect  Mood: Euthymic  Affect: Appropriate; Congruent   Thought Process  Thought Processes: Coherent  Descriptions of Associations:Intact  Orientation:Full (Time, Place and Person)  Thought Content:Logical  History of Schizophrenia/Schizoaffective disorder:No  Duration of Psychotic Symptoms:No data  recorded Hallucinations:Hallucinations: None  Ideas of Reference:None  Suicidal Thoughts:Suicidal Thoughts: No  Homicidal Thoughts:Homicidal Thoughts: No   Sensorium  Memory: Immediate Good; Recent Good  Judgment: Good  Insight: Fair   Executive Functions  Concentration: Good  Attention Span: Good  Recall: Fair  Fund of Knowledge: Fair  Language: Good   Psychomotor Activity  Psychomotor Activity: Psychomotor Activity: Normal   Assets  Assets: Communication Skills; Physical Health; Resilience   Sleep  Sleep: Sleep: Good Number of Hours of Sleep: 7    Physical Exam: Physical Exam Constitutional:      General: She is not in acute distress.    Appearance: She is normal weight. She is not toxic-appearing.  HENT:     Head: Normocephalic.     Nose: Nose normal.     Mouth/Throat:     Mouth: Mucous membranes are moist.     Pharynx: Oropharynx is clear.  Eyes:     Extraocular Movements: Extraocular movements intact.  Cardiovascular:     Rate and Rhythm: Normal rate.     Pulses: Normal pulses.  Pulmonary:     Effort: Pulmonary effort is normal.  Abdominal:     Comments: Deferred  Genitourinary:    Comments: Deferred. Musculoskeletal:        General: Normal range of motion.     Cervical back: Normal range of motion.  Skin:    General: Skin is warm.  Neurological:     General: No focal deficit present.     Mental Status: She is alert and oriented to person, place, and time.  Psychiatric:        Mood and Affect: Mood normal.        Behavior: Behavior normal.        Thought Content: Thought content normal.    ROS Blood pressure 111/64, pulse 70, temperature 98.4 F (36.9 C), temperature source Oral, resp. rate 16, height 5\' 5"  (1.651 m), weight 56.7 kg, SpO2 98%. Body mass index is 20.8 kg/m.   Social History   Tobacco Use  Smoking Status Never  Smokeless Tobacco Never   Tobacco Cessation:  N/A, patient does not currently use  tobacco products   Blood Alcohol level:  Lab Results  Component Value Date   ETH <10 02/07/2024   ETH <10 11/18/2018    Metabolic Disorder Labs:  Lab Results  Component Value Date   HGBA1C 5.5 02/07/2024   MPG 111 02/07/2024   MPG 116.89 11/25/2018   No results found for: "PROLACTIN" Lab Results  Component Value Date   CHOL 220 (H) 02/07/2024   TRIG 83 02/07/2024   HDL 80 02/07/2024   CHOLHDL 2.8 02/07/2024   VLDL 17 02/07/2024   LDLCALC 123 (H) 02/07/2024   LDLCALC 99 11/25/2018    See Psychiatric Specialty Exam and Suicide Risk Assessment completed by Attending Physician prior to discharge.  Discharge destination:  Home  Is patient on multiple antipsychotic therapies at discharge:  No   Has Patient had three or more failed trials of antipsychotic monotherapy by history:  No  Recommended Plan for Multiple Antipsychotic Therapies: NA  Discharge Instructions  Increase activity slowly   Complete by: As directed       Allergies as of 02/11/2024   No Known Allergies      Medication List     TAKE these medications      Indication  escitalopram 10 MG tablet Commonly known as: LEXAPRO Take 1 tablet (10 mg total) by mouth at bedtime.  Indication: Major Depressive Disorder   hydrOXYzine 25 MG tablet Commonly known as: ATARAX Take 1 tablet (25 mg total) by mouth 3 (three) times daily as needed for anxiety.  Indication: Feeling Anxious   Melatonin 10 MG Tabs Take 10 mg by mouth at bedtime. What changed:  medication strength how much to take when to take this reasons to take this  Indication: Trouble Sleeping   vitamin D3 25 MCG tablet Commonly known as: CHOLECALCIFEROL Take 2 tablets (2,000 Units total) by mouth daily. Start taking on: February 12, 2024  Indication: Vitamin D Deficiency        Follow-up Information     [facility] Follow up on 02/18/2024.   Specialty: Psychology Why: A referral has been made on your behalf  to this provider for [facility] therapy services.  Please call on 02/18/24 at 9:00 am to personally schedule an appointment for therapy services with Ms. Horton, or Ms. Fortier. Contact information: [address] [place] [address] [place] [address] [phone]         [facility]. Schedule an appointment as soon as possible for a visit.   Why: You may also call this provider to schedule a Virtual appointment for [facility] therapy services.  Go to website:  [email or web address] Contact informationClaris [place], [place] [address]  P: [phone]        [facility] Go on 03/09/2024.   Why: You have an appointment for medication management services on 03/09/24 at 3:00 pm. The appointment will be held in person, but you may call to switch to Virtual. Contact information: [address] [place] [address] [phone]                 Follow-up recommendations:   Discharge Recommendations:    The patient is being discharged with her family.   Patient is to take her discharge medications as ordered. ?See follow up above.   We recommend that she participates in individual therapy to target uncontrollable agitation and substance abuse.    We recommend that she participates in therapy to target the conflict with her family, to improve communication skills and conflict resolution skills. ?patient is to initiate/implement a contingency based behavioral model to address patient's behavior.   We recommend that she gets AIMS scale, height, weight, blood pressure, fasting lipid panel, fasting blood sugar in three months from discharge if she's on atypical antipsychotics.    Patient will benefit from monitoring of recurrent suicidal ideation since patient is on antidepressant medication.   The patient should abstain from all illicit substances and alcohol.   If the patient's symptoms worsen or do not continue to improve or if the patient becomes  actively suicidal or homicidal then it is recommended that the patient return to the closest hospital emergency room or call 911 for further evaluation and treatment. National Suicide Prevention Lifeline 1800-SUICIDE or 8043180342.   Please follow up with your primary medical doctor for all other medical needs.    The patient has been educated on the possible side effects to medications and she/her guardian is to contact a  medical professional and inform outpatient provider of any new side effects of medication.   She is to take regular diet and activity as tolerated. ?Will benefit from moderate daily exercise.   Patient was educated about removing/locking any firearms, medications or dangerous products from the home.      Activity:  As tolerated   Diet:  Regular Diet    Signed:  Cecilie Lowers, FNP 02/11/2024, 1:20 PM

## 2024-02-11 NOTE — Progress Notes (Signed)
  [facility] Adult Case Management Discharge Plan :  Will you be returning to the same living situation after discharge:  Yes,  home with family. At discharge, do you have transportation home?: Yes,  mother to provide Do you have the ability to pay for your medications: Yes,  active Medicaid   Release of information consent forms completed and in the chart;  Patient's signature needed at discharge.  Patient to Follow up at:  Follow-up Information     [facility] Follow up on 02/18/2024.   Specialty: Psychology Why: A referral has been made on your behalf to this provider for [facility] therapy services.  Please call on 02/18/24 at 9:00 am to personally schedule an appointment for therapy services with Ms. Horton, or Ms. Fortier. Contact information: [address] [place] [address] [place] [address] [phone]         [facility]. Schedule an appointment as soon as possible for a visit.   Why: You may also call this provider to schedule a Virtual appointment for [facility] therapy services.  Go to website:  [email or web address] Contact informationClaris [place]ower, [place] [address]  P: [phone]        [facility] Go on 03/09/2024.   Why: You have an appointment for medication management services on 03/09/24 at 3:00 pm. The appointment will be held in person, but you may call to switch to Virtual. Contact information: [address] [place] [address] [phone]                 Next level of care provider has access to [facility] Link:no  Safety Planning and Suicide Prevention discussed: Yes,  completed w/ Pt and mother.   Has patient been referred to the Quitline?: Patient does not use tobacco/nicotine products  Patient has been referred for addiction treatment: No known substance use disorder.  Joelyn Oms Rachael Ferrie, LCSW 02/11/2024, 1:47 PM

## 2024-02-11 NOTE — [facility] Suicide Risk Assessment (Signed)
 [facility] Discharge Suicide Risk Assessment   Principal Problem: MDD (major depressive disorder), recurrent severe, without psychosis (HCC) Discharge Diagnoses: Principal Problem:   MDD (major depressive disorder), recurrent severe, without psychosis (HCC) Active Problems:   Dissociation   Generalized anxiety disorder with panic attacks   Total Time spent with patient: 30 minutes  Musculoskeletal: Strength & Muscle Tone: within normal limits Gait & Station: normal Patient leans: N/A  Psychiatric Specialty Exam  Presentation  General Appearance: Casually dressed, slim build, not in any distress, good relatedness.  Appropriate behavior.  No EPS.   Eye Contact: Good   Speech: Spontaneous.  Normal rate, tone and volume.  Normal prosody of speech.   Mood and Affect  Mood: Euthymic   Affect: Full range and mood congruent.   Thought Process  Thought Processes: Linear and goal directed. Descriptions of Associations:Intact   Orientation:Full (Time, Place and Person)   Thought Content: No current suicidal thoughts.  No homicidal thoughts.  No thoughts of violence.  No guilty rumination.  Patient is future oriented.  No delusional preoccupation.  Hallucinations: No hallucination in any modality.     Sensorium  Memory: Immediate Good   Judgment: Good.   Insight: Good.   Executive Functions  Concentration: Good.   Attention Span: Good.   Recall: Dudley Major of Knowledge: Good   Language: Good     Psychomotor Activity  Normal psychomotor activity.  Physical Exam: Physical Exam ROS Blood pressure 111/64, pulse 70, temperature 98.4 F (36.9 C), temperature source Oral, resp. rate 16, height 5\' 5"  (1.651 m), weight 56.7 kg, SpO2 98%. Body mass index is 20.8 kg/m.  Mental Status Per Nursing Assessment::   On Admission:  Suicidal ideation indicated by patient  Demographic Factors:  Adolescent or young adult  Loss Factors: Financial problems/change  in socioeconomic status  Historical Factors: Prior suicide attempts and Impulsivity  Risk Reduction Factors:   Sense of responsibility to family, Religious beliefs about death, Employed, Living with another person, especially a relative, Positive social support, Positive therapeutic relationship, and Positive coping skills or problem solving skills  Continued Clinical Symptoms:  Patient has responded well to initiation of antidepressants.  There are no residual features of depression.  No overwhelming anxiety.  No manic features.  No evidence of activation.  Cognitive Features That Contribute To Risk:  None    Suicide Risk:  No current suicidal thoughts.  No homicidal thoughts.  No thoughts of violence.  Modifiable risk factor targeted during this admission as depression.  Patient has responded well to her medication.  She has good support from her family.  There are no new psychosocial stressors.  Religious beliefs as a protective factor for her. At this point in time, patient is not a danger to herself or to others.  She is stable for care at the lower setting.  Follow-up Information     [facility] Follow up on 02/18/2024.   Specialty: Psychology Why: A referral has been made on your behalf to this provider for [facility] therapy services.  Please call on 02/18/24 at 9:00 am to personally schedule an appointment for therapy services with Ms. Horton, or Ms. Fortier. Contact information: [address] [place] [address] [place] [address] [phone]         [facility]. Call.   Why: You may also call this provider to schedule a Virtual appointment for [facility] therapy services. Contact informationClaris [place]ower, [place] [address]  [phone]  [facility] Go on 03/09/2024.   Why: You have an appointment for medication management services on 03/09/24 at 3:00 pm. The appointment will be held in person, but you may call to switch to  Virtual. Contact information: [address] [place] [address] [phone]                 Plan Of Care/Follow-up recommendations:  Patient will stay on her medications as recommended.  Patient will follow up as recommended.  No restrictions with respect to diet or with level of activity.  Georgiann Cocker, MD 02/11/2024, 10:24 AM
